# Patient Record
Sex: Female | Born: 1985 | Race: White | Hispanic: No | Marital: Single | Smoking: Never smoker
Health system: Western US, Academic
[De-identification: ages and names within clinical notes are randomized; demographics above are authoritative.]

## PROBLEM LIST (undated history)

## (undated) ENCOUNTER — Inpatient Hospital Stay (HOSPITAL_COMMUNITY): Payer: Self-pay

## (undated) DIAGNOSIS — O1490 Unspecified pre-eclampsia, unspecified trimester: Secondary | ICD-10-CM

## (undated) DIAGNOSIS — N200 Calculus of kidney: Secondary | ICD-10-CM

## (undated) DIAGNOSIS — O139 Gestational [pregnancy-induced] hypertension without significant proteinuria, unspecified trimester: Secondary | ICD-10-CM

## (undated) HISTORY — DX: Gestational (pregnancy-induced) hypertension without significant proteinuria, unspecified trimester: O13.9

## (undated) HISTORY — PX: WISDOM TOOTH EXTRACTION: SHX21

## (undated) HISTORY — PX: NOSE SURGERY: SHX723

## (undated) MED ORDER — HYDROCORTISONE SOD SUCCINATE 100 MG IJ SOLR (CUSTOM)
100.0000 mg | Freq: Once | INTRAMUSCULAR | Status: AC | PRN
Start: 2021-04-04 — End: 2021-04-04

## (undated) MED ORDER — ALBUTEROL SULFATE (5 MG/ML) 0.5% IN NEBU
2.5000 mg | INHALATION_SOLUTION | Freq: Once | RESPIRATORY_TRACT | Status: AC | PRN
Start: 2021-05-22 — End: 2021-04-16

## (undated) MED ORDER — SODIUM CHLORIDE 0.9 % IV SOLN
Freq: Once | INTRAVENOUS | Status: AC
Start: 2021-03-28 — End: 2021-03-28

## (undated) MED ORDER — DIPHENHYDRAMINE HCL 50 MG/ML IJ SOLN
25.0000 mg | Freq: Once | INTRAMUSCULAR | Status: AC | PRN
Start: 2021-04-04 — End: 2021-04-04

## (undated) MED ORDER — DIPHENHYDRAMINE HCL 50 MG/ML IJ SOLN
25.0000 mg | Freq: Once | INTRAMUSCULAR | Status: AC | PRN
Start: 2021-03-28 — End: 2021-03-28

## (undated) MED ORDER — EPINEPHRINE 0.3 MG/0.3ML IJ SOAJ
0.3000 mg | INTRAMUSCULAR | Status: AC | PRN
Start: 2021-05-29 — End: 2021-04-23

## (undated) MED ORDER — HYDROCORTISONE SOD SUCCINATE 100 MG IJ SOLR (CUSTOM)
100.0000 mg | Freq: Once | INTRAMUSCULAR | Status: AC | PRN
Start: 2021-05-22 — End: 2021-04-16

## (undated) MED ORDER — SODIUM CHLORIDE 0.9 % IV SOLN
INTRAVENOUS | Status: AC | PRN
Start: 2021-05-29 — End: ?

## (undated) MED ORDER — SODIUM CHLORIDE 0.9 % IV SOLN
200.0000 mg | Freq: Once | INTRAVENOUS | Status: AC
Start: 2021-04-17 — End: 2021-04-02

## (undated) MED ORDER — SODIUM CHLORIDE 0.9 % IV SOLN
200.0000 mg | Freq: Once | INTRAVENOUS | Status: AC
Start: 2021-04-09 — End: 2021-03-26

## (undated) MED ORDER — SODIUM CHLORIDE 0.9 % IV SOLN
200.0000 mg | Freq: Once | INTRAVENOUS | Status: AC
Start: 2021-05-22 — End: 2021-04-16

## (undated) MED ORDER — EPINEPHRINE 0.3 MG/0.3ML IJ SOAJ
0.3000 mg | INTRAMUSCULAR | Status: AC | PRN
Start: 2021-03-28 — End: 2021-03-28

## (undated) MED ORDER — DIPHENHYDRAMINE HCL 50 MG/ML IJ SOLN
25.0000 mg | Freq: Once | INTRAMUSCULAR | Status: AC | PRN
Start: 2021-05-22 — End: 2021-04-16

## (undated) MED ORDER — SODIUM CHLORIDE 0.9 % IV SOLN
INTRAVENOUS | Status: AC | PRN
Start: 2021-03-28 — End: ?

## (undated) MED ORDER — FAMOTIDINE (PF) 20 MG/2ML IV SOLN
20.0000 mg | Freq: Once | INTRAVENOUS | Status: AC | PRN
Start: 2021-05-22 — End: 2021-04-16

## (undated) MED ORDER — DIPHENHYDRAMINE HCL 50 MG/ML IJ SOLN
25.0000 mg | Freq: Once | INTRAMUSCULAR | Status: AC | PRN
Start: 2021-04-09 — End: 2021-03-26

## (undated) MED ORDER — ALBUTEROL SULFATE (5 MG/ML) 0.5% IN NEBU
2.5000 mg | INHALATION_SOLUTION | Freq: Once | RESPIRATORY_TRACT | Status: AC | PRN
Start: 2021-03-28 — End: 2021-03-28

## (undated) MED ORDER — HYDROCORTISONE SOD SUCCINATE 100 MG IJ SOLR (CUSTOM)
100.0000 mg | Freq: Once | INTRAMUSCULAR | Status: AC | PRN
Start: 2021-04-24 — End: 2021-04-09

## (undated) MED ORDER — SODIUM CHLORIDE 0.9 % IV SOLN
INTRAVENOUS | Status: AC | PRN
Start: 2021-05-22 — End: ?

## (undated) MED ORDER — NAPROXEN 500 MG OR TABS
ORAL_TABLET | ORAL | 0 refills | Status: AC
Start: 2023-02-04 — End: ?

## (undated) MED ORDER — FAMOTIDINE (PF) 20 MG/2ML IV SOLN
20.0000 mg | Freq: Once | INTRAVENOUS | Status: AC | PRN
Start: 2021-05-29 — End: 2021-04-23

## (undated) MED ORDER — SODIUM CHLORIDE 0.9 % IV SOLN
200.0000 mg | Freq: Once | INTRAVENOUS | Status: AC
Start: 2021-05-29 — End: 2021-04-23

## (undated) MED ORDER — ALBUTEROL SULFATE (5 MG/ML) 0.5% IN NEBU
2.5000 mg | INHALATION_SOLUTION | Freq: Once | RESPIRATORY_TRACT | Status: AC | PRN
Start: 2021-04-24 — End: 2021-04-09

## (undated) MED ORDER — SODIUM CHLORIDE 0.9 % IV SOLN
750.0000 mg | Freq: Once | INTRAVENOUS | Status: AC
Start: 2021-04-04 — End: 2021-04-04

## (undated) MED ORDER — SODIUM CHLORIDE 0.9 % IV SOLN
Freq: Once | INTRAVENOUS | Status: AC
Start: 2021-05-29 — End: 2021-04-23

## (undated) MED ORDER — HYDROCORTISONE SOD SUCCINATE 100 MG IJ SOLR (CUSTOM)
100.0000 mg | Freq: Once | INTRAMUSCULAR | Status: AC | PRN
Start: 2021-05-29 — End: 2021-04-23

## (undated) MED ORDER — SODIUM CHLORIDE 0.9 % IV SOLN
200.0000 mg | Freq: Once | INTRAVENOUS | Status: AC
Start: 2021-04-24 — End: 2021-04-09

## (undated) MED ORDER — HYDROCORTISONE SOD SUCCINATE 100 MG IJ SOLR (CUSTOM)
100.0000 mg | Freq: Once | INTRAMUSCULAR | Status: AC | PRN
Start: 2021-04-09 — End: 2021-03-26

## (undated) MED ORDER — ALBUTEROL SULFATE (5 MG/ML) 0.5% IN NEBU
2.5000 mg | INHALATION_SOLUTION | Freq: Once | RESPIRATORY_TRACT | Status: AC | PRN
Start: 2021-05-29 — End: 2021-04-23

## (undated) MED ORDER — SODIUM CHLORIDE 0.9 % IV SOLN
Freq: Once | INTRAVENOUS | Status: AC
Start: 2021-05-22 — End: 2021-04-16

## (undated) MED ORDER — EPINEPHRINE 0.3 MG/0.3ML IJ SOAJ
0.3000 mg | INTRAMUSCULAR | Status: AC | PRN
Start: 2021-04-24 — End: 2021-04-09

## (undated) MED ORDER — EPINEPHRINE 0.3 MG/0.3ML IJ SOAJ
0.3000 mg | INTRAMUSCULAR | Status: AC | PRN
Start: 2021-04-09 — End: 2021-03-26

## (undated) MED ORDER — FAMOTIDINE (PF) 20 MG/2ML IV SOLN
20.0000 mg | Freq: Once | INTRAVENOUS | Status: AC | PRN
Start: 2021-04-09 — End: 2021-03-26

## (undated) MED ORDER — DIPHENHYDRAMINE HCL 50 MG/ML IJ SOLN
25.0000 mg | Freq: Once | INTRAMUSCULAR | Status: AC | PRN
Start: 2021-05-29 — End: 2021-04-23

## (undated) MED ORDER — SODIUM CHLORIDE 0.9 % IV SOLN
INTRAVENOUS | Status: AC | PRN
Start: 2021-04-04 — End: ?

## (undated) MED ORDER — SODIUM CHLORIDE 0.9 % IV SOLN
Freq: Once | INTRAVENOUS | Status: AC
Start: 2021-04-09 — End: 2021-03-26

## (undated) MED ORDER — FAMOTIDINE (PF) 20 MG/2ML IV SOLN
20.0000 mg | Freq: Once | INTRAVENOUS | Status: AC | PRN
Start: 2021-04-24 — End: 2021-04-09

## (undated) MED ORDER — DIPHENHYDRAMINE HCL 50 MG/ML IJ SOLN
25.0000 mg | Freq: Once | INTRAMUSCULAR | Status: AC | PRN
Start: 2021-04-17 — End: 2021-04-02

## (undated) MED ORDER — FAMOTIDINE (PF) 20 MG/2ML IV SOLN
20.0000 mg | Freq: Once | INTRAVENOUS | Status: AC | PRN
Start: 2021-04-04 — End: 2021-04-04

## (undated) MED ORDER — SODIUM CHLORIDE 0.9 % IV SOLN
Freq: Once | INTRAVENOUS | Status: AC
Start: 2021-04-04 — End: 2021-04-04

## (undated) MED ORDER — ALBUTEROL SULFATE (5 MG/ML) 0.5% IN NEBU
2.5000 mg | INHALATION_SOLUTION | Freq: Once | RESPIRATORY_TRACT | Status: AC | PRN
Start: 2021-04-17 — End: 2021-04-02

## (undated) MED ORDER — EPINEPHRINE 0.3 MG/0.3ML IJ SOAJ
0.3000 mg | INTRAMUSCULAR | Status: AC | PRN
Start: 2021-05-22 — End: 2021-04-16

## (undated) MED ORDER — ALBUTEROL SULFATE (5 MG/ML) 0.5% IN NEBU
2.5000 mg | INHALATION_SOLUTION | Freq: Once | RESPIRATORY_TRACT | Status: AC | PRN
Start: 2021-04-09 — End: 2021-03-26

## (undated) MED ORDER — SODIUM CHLORIDE 0.9 % IV SOLN
INTRAVENOUS | Status: AC | PRN
Start: 2021-04-17 — End: ?

## (undated) MED ORDER — FAMOTIDINE (PF) 20 MG/2ML IV SOLN
20.0000 mg | Freq: Once | INTRAVENOUS | Status: AC | PRN
Start: 2021-04-17 — End: 2021-04-02

## (undated) MED ORDER — SODIUM CHLORIDE 0.9 % IV SOLN
INTRAVENOUS | Status: AC | PRN
Start: 2021-04-24 — End: ?

## (undated) MED ORDER — SODIUM CHLORIDE 0.9 % IV SOLN
INTRAVENOUS | Status: AC | PRN
Start: 2021-04-09 — End: ?

## (undated) MED ORDER — SODIUM CHLORIDE 0.9 % IV SOLN
Freq: Once | INTRAVENOUS | Status: AC
Start: 2021-04-24 — End: 2021-04-09

## (undated) MED ORDER — ALBUTEROL SULFATE (5 MG/ML) 0.5% IN NEBU
2.5000 mg | INHALATION_SOLUTION | Freq: Once | RESPIRATORY_TRACT | Status: AC | PRN
Start: 2021-04-04 — End: 2021-04-04

## (undated) MED ORDER — HYDROCORTISONE SOD SUCCINATE 100 MG IJ SOLR (CUSTOM)
100.0000 mg | Freq: Once | INTRAMUSCULAR | Status: AC | PRN
Start: 2021-03-28 — End: 2021-03-28

## (undated) MED ORDER — SODIUM CHLORIDE 0.9 % IV SOLN
Freq: Once | INTRAVENOUS | Status: AC
Start: 2021-04-17 — End: 2021-04-02

## (undated) MED ORDER — FAMOTIDINE (PF) 20 MG/2ML IV SOLN
20.0000 mg | Freq: Once | INTRAVENOUS | Status: AC | PRN
Start: 2021-03-28 — End: 2021-03-28

## (undated) MED ORDER — EPINEPHRINE 0.3 MG/0.3ML IJ SOAJ
0.3000 mg | INTRAMUSCULAR | Status: AC | PRN
Start: 2021-04-04 — End: 2021-04-04

## (undated) MED ORDER — SODIUM CHLORIDE 0.9 % IV SOLN
750.0000 mg | Freq: Once | INTRAVENOUS | Status: AC
Start: 2021-03-28 — End: 2021-03-28

## (undated) MED ORDER — EPINEPHRINE 0.3 MG/0.3ML IJ SOAJ
0.3000 mg | INTRAMUSCULAR | Status: AC | PRN
Start: 2021-04-17 — End: 2021-04-02

## (undated) MED ORDER — HYDROCORTISONE SOD SUCCINATE 100 MG IJ SOLR (CUSTOM)
100.0000 mg | Freq: Once | INTRAMUSCULAR | Status: AC | PRN
Start: 2021-04-17 — End: 2021-04-02

## (undated) MED ORDER — DIPHENHYDRAMINE HCL 50 MG/ML IJ SOLN
25.0000 mg | Freq: Once | INTRAMUSCULAR | Status: AC | PRN
Start: 2021-04-24 — End: 2021-04-09

---
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
## 2006-05-06 ENCOUNTER — Other Ambulatory Visit: Admission: RE | Admit: 2006-05-06 | Discharge: 2006-05-06 | Payer: Self-pay | Admitting: Obstetrics and Gynecology

## 2009-02-19 ENCOUNTER — Emergency Department (HOSPITAL_COMMUNITY): Admission: EM | Admit: 2009-02-19 | Discharge: 2009-02-19 | Payer: Self-pay | Admitting: Emergency Medicine

## 2011-01-15 LAB — URINE MICROSCOPIC-ADD ON

## 2011-01-15 LAB — URINALYSIS, ROUTINE W REFLEX MICROSCOPIC
Glucose, UA: NEGATIVE mg/dL
Ketones, ur: NEGATIVE mg/dL
Protein, ur: NEGATIVE mg/dL

## 2011-01-15 LAB — PREGNANCY, URINE: Preg Test, Ur: NEGATIVE

## 2011-06-04 ENCOUNTER — Emergency Department (HOSPITAL_COMMUNITY)
Admission: EM | Admit: 2011-06-04 | Discharge: 2011-06-04 | Disposition: A | Discharge status: Home / Self Care | Payer: BC Managed Care – PPO | Attending: Emergency Medicine | Admitting: Emergency Medicine

## 2011-06-04 DIAGNOSIS — R319 Hematuria, unspecified: Secondary | ICD-10-CM | POA: Insufficient documentation

## 2011-06-04 DIAGNOSIS — R109 Unspecified abdominal pain: Secondary | ICD-10-CM | POA: Insufficient documentation

## 2011-06-04 DIAGNOSIS — R110 Nausea: Secondary | ICD-10-CM | POA: Insufficient documentation

## 2011-06-04 LAB — POCT PREGNANCY, URINE: Preg Test, Ur: NEGATIVE

## 2011-06-04 LAB — URINE MICROSCOPIC-ADD ON

## 2011-06-04 LAB — URINALYSIS, ROUTINE W REFLEX MICROSCOPIC
Ketones, ur: NEGATIVE mg/dL
Urobilinogen, UA: 0.2 mg/dL (ref 0.0–1.0)

## 2011-06-05 LAB — URINE CULTURE: Culture  Setup Time: 201208280640

## 2012-03-15 ENCOUNTER — Encounter (HOSPITAL_COMMUNITY): Payer: Self-pay | Admitting: Emergency Medicine

## 2012-03-15 ENCOUNTER — Emergency Department (HOSPITAL_COMMUNITY)
Admission: EM | Admit: 2012-03-15 | Discharge: 2012-03-15 | Disposition: A | Discharge status: Home / Self Care | Payer: BC Managed Care – PPO | Attending: Emergency Medicine | Admitting: Emergency Medicine

## 2012-03-15 ENCOUNTER — Emergency Department (HOSPITAL_COMMUNITY): Payer: BC Managed Care – PPO

## 2012-03-15 DIAGNOSIS — R10819 Abdominal tenderness, unspecified site: Secondary | ICD-10-CM | POA: Insufficient documentation

## 2012-03-15 DIAGNOSIS — N132 Hydronephrosis with renal and ureteral calculous obstruction: Secondary | ICD-10-CM

## 2012-03-15 DIAGNOSIS — M545 Low back pain, unspecified: Secondary | ICD-10-CM | POA: Insufficient documentation

## 2012-03-15 DIAGNOSIS — N201 Calculus of ureter: Secondary | ICD-10-CM | POA: Insufficient documentation

## 2012-03-15 DIAGNOSIS — R109 Unspecified abdominal pain: Secondary | ICD-10-CM | POA: Insufficient documentation

## 2012-03-15 HISTORY — DX: Calculus of kidney: N20.0

## 2012-03-15 LAB — URINALYSIS, ROUTINE W REFLEX MICROSCOPIC
Bilirubin Urine: NEGATIVE
Glucose, UA: NEGATIVE mg/dL
Ketones, ur: NEGATIVE mg/dL
Leukocytes, UA: NEGATIVE
Nitrite: NEGATIVE
Specific Gravity, Urine: 1.015 (ref 1.005–1.030)
pH: 7 (ref 5.0–8.0)

## 2012-03-15 LAB — CBC
HCT: 42.2 % (ref 36.0–46.0)
Hemoglobin: 14.2 g/dL (ref 12.0–15.0)
MCH: 29.4 pg (ref 26.0–34.0)
MCHC: 33.6 g/dL (ref 30.0–36.0)
RDW: 12.6 % (ref 11.5–15.5)

## 2012-03-15 LAB — BASIC METABOLIC PANEL
BUN: 11 mg/dL (ref 6–23)
Calcium: 9.4 mg/dL (ref 8.4–10.5)
Creatinine, Ser: 0.72 mg/dL (ref 0.50–1.10)
GFR calc Af Amer: >90 mL/min (ref >90)
GFR calc non Af Amer: >90 mL/min (ref >90)
Glucose, Bld: 99 mg/dL (ref 70–99)

## 2012-03-15 LAB — WET PREP, GENITAL: Yeast Wet Prep HPF POC: NONE SEEN

## 2012-03-15 MED ORDER — SODIUM CHLORIDE 0.9 % IV BOLUS (SEPSIS)
1000.0000 mL | Freq: Once | INTRAVENOUS | Status: AC
  Administered 2012-03-15: 1000 mL via INTRAVENOUS

## 2012-03-15 MED ORDER — ONDANSETRON HCL 4 MG/2ML IJ SOLN
4.0000 mg | Freq: Once | INTRAMUSCULAR | Status: AC
  Administered 2012-03-15: 4 mg via INTRAVENOUS
  Filled 2012-03-15: qty 2

## 2012-03-15 MED ORDER — LABETALOL HCL 5 MG/ML IV SOLN: 5.0000 mg | Freq: Once | INTRAVENOUS | Status: DC

## 2012-03-15 MED ORDER — ONDANSETRON HCL 4 MG PO TABS: 4.0000 mg | ORAL_TABLET | Freq: Four times a day (QID) | ORAL | Status: AC

## 2012-03-15 MED ORDER — OXYCODONE-ACETAMINOPHEN 5-325 MG PO TABS: 1.0000 | ORAL_TABLET | Freq: Four times a day (QID) | ORAL | Status: AC | PRN

## 2012-03-15 MED ORDER — SODIUM CHLORIDE 0.9 % IV SOLN: 250.0000 mL | Freq: Once | INTRAVENOUS | Status: DC

## 2012-03-15 MED ORDER — ALTEPLASE (STROKE) FULL DOSE INFUSION: 0.9000 mg/kg | Freq: Once | INTRAVENOUS | Status: DC

## 2012-03-15 MED ORDER — MORPHINE SULFATE 4 MG/ML IJ SOLN
4.0000 mg | Freq: Once | INTRAMUSCULAR | Status: AC
Start: 2012-03-15 — End: 2012-03-15
  Administered 2012-03-15: 4 mg via INTRAVENOUS
  Filled 2012-03-15: qty 1

## 2012-03-15 NOTE — ED Notes (Signed)
Pt presents to department for evaluation of lower abdominal and lower back pain. Onset this morning. Also states nausea. Denies urinary symptoms at the time. States history of kidney stones. 9/10 pain at the time. Abdomen soft and non tender to palpation. Skin warm and dry. She is alert and oriented x4. She recently started taking a new birth control pill. No signs of distress noted.

## 2012-03-15 NOTE — ED Notes (Signed)
Pt c/o low abdominal pain and left low back pain onset this morning. Pt reports pain feels same like kidney stone just in different place. Pt reports nausea. Pt recently started on new birth control pill.

## 2012-03-15 NOTE — ED Notes (Signed)
Pelvic cart at the bedside. Provider notified.

## 2012-03-15 NOTE — Discharge Instructions (Signed)
Kidney Stones       Kidney stones (ureteral lithiasis) are deposits that form inside your kidneys. The intense pain is caused by the stone moving through the urinary tract. When the stone moves, the ureter goes into spasm around the stone. The stone is usually passed in the urine.   CAUSES   A disorder that makes certain neck glands produce too much parathyroid hormone (primary hyperparathyroidism).   A buildup of uric acid crystals.   Narrowing (stricture) of the ureter.   A kidney obstruction present at birth (congenital obstruction).   Previous surgery on the kidney or ureters.   Numerous kidney infections.  SYMPTOMS   Feeling sick to your stomach (nauseous).   Throwing up (vomiting).   Blood in the urine (hematuria).   Pain that usually spreads (radiates) to the groin.   Frequency or urgency of urination.  DIAGNOSIS   Taking a history and physical exam.   Blood or urine tests.   Computerized X-ray scan (CT scan).   Occasionally, an examination of the inside of the urinary bladder (cystoscopy) is performed.  TREATMENT   Observation.   Increasing your fluid intake.   Surgery may be needed if you have severe pain or persistent obstruction.  The size, location, and chemical composition are all important variables that will determine the proper choice of action for you. Talk to your caregiver to better understand your situation so that you will minimize the risk of injury to yourself and your kidney.   HOME CARE INSTRUCTIONS   Drink enough water and fluids to keep your urine clear or pale yellow.   Strain all urine through the provided strainer. Keep all particulate matter and stones for your caregiver to see. The stone causing the pain may be as small as a grain of salt. It is very important to use the strainer each and every time you pass your urine. The collection of your stone will allow your caregiver to analyze it and verify that a stone has actually passed.   Only take over-the-counter or prescription  medicines for pain, discomfort, or fever as directed by your caregiver.   Make a follow-up appointment with your caregiver as directed.   Get follow-up X-rays if required. The absence of pain does not always mean that the stone has passed. It may have only stopped moving. If the urine remains completely obstructed, it can cause loss of kidney function or even complete destruction of the kidney. It is your responsibility to make sure X-rays and follow-ups are completed. Ultrasounds of the kidney can show blockages and the status of the kidney. Ultrasounds are not associated with any radiation and can be performed easily in a matter of minutes.  SEEK IMMEDIATE MEDICAL CARE IF:   Pain cannot be controlled with the prescribed medicine.   You have a fever.   The severity or intensity of pain increases over 18 hours and is not relieved by pain medicine.   You develop a new onset of abdominal pain.   You feel faint or pass out.  MAKE SURE YOU:   Understand these instructions.   Will watch your condition.   Will get help right away if you are not doing well or get worse.  Document Released: 09/23/2005 Document Revised: 09/12/2011 Document Reviewed: 01/19/2010   ExitCare Patient Information 2012 [employer]     Urine Strainer   This strainer is used to catch or filter out any stones found in your urine. Place the strainer under your   urine stream. Save any stones or objects that you find in your urine. Place them in a plastic or glass container to show your caregiver. The stones vary in size - some can be very small, so make sure you check the strainer carefully. Your caregiver may send the stone to the lab. When the results are back, your caregiver may recommend medicines or diet changes.   Document Released: 06/28/2004 Document Revised: 09/12/2011 Document Reviewed: 08/05/2008   [employer] Patient Information 2012 [employer]

## 2012-03-15 NOTE — ED Provider Notes (Signed)
Medical screening examination/treatment/procedure(s) were performed by non-physician practitioner and as supervising physician I was immediately available for consultation/collaboration.    Dione Booze, MD 03/15/12 1517

## 2012-03-15 NOTE — ED Provider Notes (Signed)
History     CSN: [id number]  Arrival date & time 03/15/12  [id number]   First MD Initiated Contact with Patient 03/15/12 [phone]      Chief Complaint  Patient presents with  . Abdominal Pain  . Back Pain    (Consider location/radiation/quality/duration/timing/severity/associated sxs/prior treatment) HPI  Patient to the ED with complaints of low back pain and low suprapubic abdominal pain. It started acutely this morning and the pains are sharp and knife life. She states that she has had kidney stones before and that the quality of this pain is the same, however, a different location. She also feels pressure. She denies diarrhea, vomiting, recent illness or injury.  Past Medical History  Diagnosis Date  . Kidney stones     Past Surgical History  Procedure Date  . Nose surgery     History reviewed. No pertinent family history.  History  Substance Use Topics  . Smoking status: Never Smoker   . Smokeless tobacco: Not on file  . Alcohol Use: Yes    OB History    Grav Para Term Preterm Abortions TAB SAB Ect Mult Living                  Review of Systems   HEENT: denies blurry vision or change in hearing PULMONARY: Denies difficulty breathing and SOB CARDIAC: denies chest pain or heart palpitations MUSCULOSKELETAL:  denies being unable to ambulate ABDOMEN AL: denies diarrhea GU: denies loss of bowel or urinary control NEURO: denies numbness and tingling in extremities SKIN: no new rashes PSYCH: patient behavior is normal NECK: Not complaining of neck pain     Allergies  Review of patient's allergies indicates no known allergies.  Home Medications   Current Outpatient Rx  Name Route Sig Dispense Refill  . NORGESTIMATE-ETH ESTRADIOL 0.25-35 MG-MCG PO TABS Oral Take 1 tablet by mouth daily.    Marland Kitchen ONDANSETRON HCL 4 MG PO TABS Oral Take 1 tablet (4 mg total) by mouth every 6 (six) hours. 12 tablet 0  . OXYCODONE-ACETAMINOPHEN 5-325 MG PO TABS Oral Take 1 tablet by  mouth every 6 (six) hours as needed for pain. 15 tablet 0    BP 130/83  Pulse 87  Temp(Src) 97.9 F (36.6 C) (Oral)  Resp 18  SpO2 99%  LMP 02/21/2012  Physical Exam  Nursing note and vitals reviewed. Constitutional: She appears well-developed and well-nourished. No distress.  HENT:  Head: Normocephalic and atraumatic.  Eyes: Pupils are equal, round, and reactive to light.  Neck: Normal range of motion. Neck supple.  Cardiovascular: Normal rate and regular rhythm.   Pulmonary/Chest: Effort normal.  Abdominal: Soft. She exhibits no distension. There is tenderness (suprapubic and LLQ). There is no rebound and no guarding.  Genitourinary: Vagina normal and uterus normal. There is no tenderness on the right labia. There is no tenderness on the left labia. Cervix exhibits no motion tenderness, no discharge and no friability. Right adnexum displays no mass and no tenderness. Left adnexum displays no mass and no tenderness.  Neurological: She is alert.  Skin: Skin is warm and dry.    ED Course  Procedures (including critical care time)  Labs Reviewed  URINALYSIS, ROUTINE W REFLEX MICROSCOPIC - Abnormal; Notable for the following:    Hgb urine dipstick SMALL (*)    All other components within normal limits  WET PREP, GENITAL - Abnormal; Notable for the following:    WBC, Wet Prep HPF POC FEW (*)    All other components  within normal limits  PREGNANCY, URINE  CBC  BASIC METABOLIC PANEL  URINE MICROSCOPIC-ADD ON  GC/CHLAMYDIA PROBE AMP, GENITAL   Ct Abdomen Pelvis Wo Contrast  03/15/2012  *RADIOLOGY REPORT*  Clinical Data: History of kidney stones and low back pain.  CT ABDOMEN AND PELVIS WITHOUT CONTRAST  Technique:  Multidetector CT imaging of the abdomen and pelvis was performed following the standard protocol without intravenous contrast.  Comparison: Abdominal radiograph 02/19/2009  Findings: The lung bases are clear.  There is no evidence for free air. Unenhanced CT was  performed per clinician order.  Lack of IV contrast limits sensitivity and specificity, especially for evaluation of abdominal/pelvic solid viscera.  There are multiple small calcifications throughout both kidneys suggestive for kidney stones.  Largest kidney stone is in the left lower pole and measures 5 mm.  There is a stone at the left base of the bladder which is probably at the left ureterovesical junction. This stone roughly measures 4 mm in size. There is a 7 mm calcification in the right hemi pelvis.  This is adjacent the right adnexa and the cecum.  This may represent an appendicolith.  There is no inflammation in this area to suggest acute appendicitis. There is a moderate amount of stool throughout the colon.  No gross abnormality of the liver, gallbladder, spleen, pancreas or adrenal tissue.  No significant free fluid or lymphadenopathy within the abdomen or pelvis. No acute bony abnormality.  IMPRESSION: Bilateral renal calculi.  Mild left hydroureteronephrosis.  There is a 4 mm stone at the left ureterovesical junction.  There is a 7 mm calcification in the right hemipelvis.  The exact location of this calcification is uncertain but may represent an appendicolith.  No evidence for acute appendix inflammation.  Original Report Authenticated By: Richarda Overlie, M.D.     1. Ureteral stone with hydronephrosis       MDM  Patient laboratory work-up is negative for acute findings.  Patients CT of the abdomen shows a 4mm stone in the left UVJ. Patient should be able to pass this on her own. Patient has NO pain the the RLQ as i see 7mm calcification noted in the RLQ.  Patient plan is to 1. Strain all urine 2. Rx for percocet 3. Zofran 4. Work note 5. Referral to Urology  Pt has been advised of the symptoms that warrant their return to the ED. Patient has voiced understanding and has agreed to follow-up with the PCP or specialist.         Dorthula Matas, PA 03/15/12 1115

## 2012-03-16 LAB — GC/CHLAMYDIA PROBE AMP, GENITAL
Chlamydia, DNA Probe: NEGATIVE
GC Probe Amp, Genital: NEGATIVE

## 2012-09-17 ENCOUNTER — Other Ambulatory Visit: Payer: Self-pay | Admitting: Family Medicine

## 2012-09-17 ENCOUNTER — Ambulatory Visit
Admission: RE | Admit: 2012-09-17 | Discharge: 2012-09-17 | Disposition: A | Discharge status: Home / Self Care | Payer: BC Managed Care – PPO | Source: Ambulatory Visit | Attending: Family Medicine | Admitting: Family Medicine

## 2012-09-17 DIAGNOSIS — R222 Localized swelling, mass and lump, trunk: Secondary | ICD-10-CM

## 2012-10-07 DIAGNOSIS — O1490 Unspecified pre-eclampsia, unspecified trimester: Secondary | ICD-10-CM

## 2012-10-07 HISTORY — DX: Unspecified pre-eclampsia, unspecified trimester: O14.90

## 2013-09-06 ENCOUNTER — Inpatient Hospital Stay (HOSPITAL_COMMUNITY)
Admission: AD | Admit: 2013-09-06 | Discharge: 2013-09-06 | Disposition: A | Discharge status: Home / Self Care | Payer: 59 | Source: Ambulatory Visit | Attending: Obstetrics and Gynecology | Admitting: Obstetrics and Gynecology

## 2013-09-06 ENCOUNTER — Encounter (HOSPITAL_COMMUNITY): Payer: Self-pay | Admitting: *Deleted

## 2013-09-06 DIAGNOSIS — O133 Gestational [pregnancy-induced] hypertension without significant proteinuria, third trimester: Secondary | ICD-10-CM

## 2013-09-06 DIAGNOSIS — R109 Unspecified abdominal pain: Secondary | ICD-10-CM

## 2013-09-06 DIAGNOSIS — R030 Elevated blood-pressure reading, without diagnosis of hypertension: Secondary | ICD-10-CM

## 2013-09-06 DIAGNOSIS — R51 Headache: Secondary | ICD-10-CM

## 2013-09-06 DIAGNOSIS — IMO0002 Reserved for concepts with insufficient information to code with codable children: Secondary | ICD-10-CM | POA: Insufficient documentation

## 2013-09-06 DIAGNOSIS — O139 Gestational [pregnancy-induced] hypertension without significant proteinuria, unspecified trimester: Secondary | ICD-10-CM | POA: Insufficient documentation

## 2013-09-06 DIAGNOSIS — H543 Unqualified visual loss, both eyes: Secondary | ICD-10-CM

## 2013-09-06 LAB — COMPREHENSIVE METABOLIC PANEL
Albumin: 3.1 g/dL — ABNORMAL LOW (ref 3.5–5.2)
Alkaline Phosphatase: 143 U/L — ABNORMAL HIGH (ref 39–117)
BUN: 7 mg/dL (ref 6–23)
CO2: 23 mEq/L (ref 19–32)
Chloride: 103 mEq/L (ref 96–112)
GFR calc non Af Amer: >90 mL/min (ref >90)
Potassium: 3.8 mEq/L (ref 3.5–5.1)
Total Bilirubin: 0.3 mg/dL (ref 0.3–1.2)

## 2013-09-06 LAB — CBC
MCH: 30 pg (ref 26.0–34.0)
MCV: 87.8 fL (ref 78.0–100.0)
Platelets: 197 10*3/uL (ref 150–400)
RDW: 13.4 % (ref 11.5–15.5)
WBC: 13 10*3/uL — ABNORMAL HIGH (ref 4.0–10.5)

## 2013-09-06 LAB — URINALYSIS, ROUTINE W REFLEX MICROSCOPIC
Glucose, UA: NEGATIVE mg/dL
Ketones, ur: NEGATIVE mg/dL
Protein, ur: NEGATIVE mg/dL

## 2013-09-06 LAB — URINE MICROSCOPIC-ADD ON

## 2013-09-06 LAB — URIC ACID: Uric Acid, Serum: 4.8 mg/dL (ref 2.4–7.0)

## 2013-09-06 MED ORDER — BETAMETHASONE SOD PHOS & ACET 6 (3-3) MG/ML IJ SUSP
12.0000 mg | Freq: Once | INTRAMUSCULAR | Status: AC
  Administered 2013-09-06: 12 mg via INTRAMUSCULAR
  Filled 2013-09-06: qty 2

## 2013-09-06 MED ORDER — LABETALOL HCL 100 MG PO TABS
100.0000 mg | ORAL_TABLET | Freq: Once | ORAL | Status: AC
  Administered 2013-09-06: 100 mg via ORAL
  Filled 2013-09-06: qty 1

## 2013-09-06 MED ORDER — LABETALOL HCL 100 MG PO TABS: 100.0000 mg | ORAL_TABLET | Freq: Two times a day (BID) | ORAL | Status: DC

## 2013-09-06 NOTE — MAU Provider Note (Signed)
HPI:  Ms. NYIA TSAO is a 27 y.o. female G1P0 at Unknown gestation who presents with elevated BP.  Her primary care MD was concerned last week about her BP, and increased weight gain. She had her BP at school today and it was very elevated; she went home and rested and BP went down slightly. She went by the Dr. Isidore Moos this afternoon, and they sent her here for further work up.  Today is the highest her BP has been. She denies HA, blurred vision, upper abdominal pain. She does feel her swelling in her feet has increased in the past week. She denies pain at this time.    Objective:  GENERAL: Well-developed, well-nourished female in no acute distress.  HEENT: Normocephalic, atraumatic.   LUNGS: Effort normal HEART: Regular rate  SKIN: Warm, dry and without erythema, edema to bilateral lower extremities.  PSYCH: Normal mood and affect  Filed Vitals:   09/06/13 1835  BP: 165/100  Pulse: 91  Resp: 16  Height: 5\' 5"  (1.651 m)  Weight: 68.493 kg (151 lb)   Fetal Tracing: Baseline: 135 bpm  Variability: Moderate  Accelerations: 15x15 Decelerations: None  Toco: none   Pt came with orders from Dr. Ambrose Mantle; RN to call Dr. Ambrose Mantle with results 2100 RN reviewed BLood pressure and results to labetalol with Dr. Ambrose Mantle, orders for BMZ and Labetalol BID. Return tomorrow for another BMZ.   A/P:  Gestational Hypertension FU in 1 day for repeat BMZ Start Labetalol   Iona Hansen Rasch, NP 09/06/2013 7:01 PM

## 2013-09-07 ENCOUNTER — Inpatient Hospital Stay (HOSPITAL_COMMUNITY)
Admission: AD | Admit: 2013-09-07 | Discharge: 2013-09-07 | Disposition: A | Discharge status: Home / Self Care | Payer: 59 | Source: Ambulatory Visit | Attending: Obstetrics and Gynecology | Admitting: Obstetrics and Gynecology

## 2013-09-07 DIAGNOSIS — O4700 False labor before 37 completed weeks of gestation, unspecified trimester: Secondary | ICD-10-CM | POA: Insufficient documentation

## 2013-09-07 MED ORDER — BETAMETHASONE SOD PHOS & ACET 6 (3-3) MG/ML IJ SUSP
12.0000 mg | Freq: Once | INTRAMUSCULAR | Status: AC
  Administered 2013-09-07: 12 mg via INTRAMUSCULAR
  Filled 2013-09-07: qty 2

## 2013-09-27 ENCOUNTER — Other Ambulatory Visit: Payer: Self-pay | Admitting: Obstetrics and Gynecology

## 2013-09-30 ENCOUNTER — Inpatient Hospital Stay (HOSPITAL_COMMUNITY)
Admission: AD | Admit: 2013-09-30 | Discharge: 2013-10-04 | DRG: 765 | Disposition: A | Discharge status: Home / Self Care | Payer: 59 | Source: Ambulatory Visit | Attending: Obstetrics and Gynecology | Admitting: Obstetrics and Gynecology

## 2013-09-30 ENCOUNTER — Encounter (HOSPITAL_COMMUNITY): Payer: Self-pay | Admitting: *Deleted

## 2013-09-30 ENCOUNTER — Inpatient Hospital Stay (HOSPITAL_COMMUNITY)
Admission: AD | Admit: 2013-09-30 | Discharge: 2013-09-30 | Disposition: A | Discharge status: Home / Self Care | Payer: 59 | Source: Ambulatory Visit | Attending: Obstetrics and Gynecology | Admitting: Obstetrics and Gynecology

## 2013-09-30 DIAGNOSIS — O1493 Unspecified pre-eclampsia, third trimester: Secondary | ICD-10-CM

## 2013-09-30 DIAGNOSIS — O139 Gestational [pregnancy-induced] hypertension without significant proteinuria, unspecified trimester: Secondary | ICD-10-CM | POA: Insufficient documentation

## 2013-09-30 DIAGNOSIS — O1414 Severe pre-eclampsia complicating childbirth: Principal | ICD-10-CM | POA: Diagnosis present

## 2013-09-30 DIAGNOSIS — O1490 Unspecified pre-eclampsia, unspecified trimester: Secondary | ICD-10-CM | POA: Diagnosis present

## 2013-09-30 DIAGNOSIS — O1423 HELLP syndrome (HELLP), third trimester: Secondary | ICD-10-CM

## 2013-09-30 LAB — CBC
HCT: 32.5 % — ABNORMAL LOW (ref 36.0–46.0)
Hemoglobin: 12.3 g/dL (ref 12.0–15.0)
MCH: 30.1 pg (ref 26.0–34.0)
MCHC: 33.8 g/dL (ref 30.0–36.0)
MCHC: 34.5 g/dL (ref 30.0–36.0)
MCV: 89 fL (ref 78.0–100.0)
MCV: 88.4 fL (ref 78.0–100.0)
Platelets: 161 10*3/uL (ref 150–400)
RDW: 13.5 % (ref 11.5–15.5)
RDW: 13.5 % (ref 11.5–15.5)
WBC: 13 10*3/uL — ABNORMAL HIGH (ref 4.0–10.5)

## 2013-09-30 LAB — COMPREHENSIVE METABOLIC PANEL
ALT: 32 U/L (ref 0–35)
AST: 15 U/L (ref 0–37)
Albumin: 2.6 g/dL — ABNORMAL LOW (ref 3.5–5.2)
Albumin: 2.8 g/dL — ABNORMAL LOW (ref 3.5–5.2)
BUN: 11 mg/dL (ref 6–23)
CO2: 21 mEq/L (ref 19–32)
Calcium: 9.1 mg/dL (ref 8.4–10.5)
Calcium: 9.5 mg/dL (ref 8.4–10.5)
Creatinine, Ser: 0.58 mg/dL (ref 0.50–1.10)
GFR calc Af Amer: >90 mL/min (ref >90)
GFR calc non Af Amer: >90 mL/min (ref >90)
Glucose, Bld: 92 mg/dL (ref 70–99)
Sodium: 136 mEq/L (ref 135–145)
Total Bilirubin: 0.2 mg/dL — ABNORMAL LOW (ref 0.3–1.2)
Total Protein: 6.2 g/dL (ref 6.0–8.3)
Total Protein: 6.8 g/dL (ref 6.0–8.3)

## 2013-09-30 LAB — URINALYSIS, ROUTINE W REFLEX MICROSCOPIC
Bilirubin Urine: NEGATIVE
Glucose, UA: NEGATIVE mg/dL
Glucose, UA: NEGATIVE mg/dL
Ketones, ur: NEGATIVE mg/dL
Leukocytes, UA: NEGATIVE
Nitrite: NEGATIVE
Protein, ur: 100 mg/dL — AB
Protein, ur: >300 mg/dL — AB
Specific Gravity, Urine: >1.03 — ABNORMAL HIGH (ref 1.005–1.030)
Urobilinogen, UA: 0.2 mg/dL (ref 0.0–1.0)

## 2013-09-30 LAB — URINE MICROSCOPIC-ADD ON

## 2013-09-30 MED ORDER — CITRIC ACID-SODIUM CITRATE 334-500 MG/5ML PO SOLN
30.0000 mL | Freq: Once | ORAL | Status: AC
  Administered 2013-10-01: 30 mL via ORAL
  Filled 2013-09-30: qty 15

## 2013-09-30 MED ORDER — LACTATED RINGERS IV SOLN
INTRAVENOUS | Status: DC
  Administered 2013-09-30 – 2013-10-01 (×3): via INTRAVENOUS

## 2013-09-30 MED ORDER — MAGNESIUM SULFATE BOLUS VIA INFUSION
4.0000 g | Freq: Once | INTRAVENOUS | Status: AC
  Administered 2013-09-30: 4 g via INTRAVENOUS
  Filled 2013-09-30: qty 500

## 2013-09-30 MED ORDER — LABETALOL HCL 5 MG/ML IV SOLN: 40.0000 mg | Freq: Once | INTRAVENOUS | Status: DC | PRN

## 2013-09-30 MED ORDER — FAMOTIDINE IN NACL 20-0.9 MG/50ML-% IV SOLN
20.0000 mg | Freq: Once | INTRAVENOUS | Status: AC
  Administered 2013-09-30: 20 mg via INTRAVENOUS
  Filled 2013-09-30: qty 50

## 2013-09-30 MED ORDER — LABETALOL HCL 5 MG/ML IV SOLN
10.0000 mg | Freq: Once | INTRAVENOUS | Status: AC
  Administered 2013-09-30: 10 mg via INTRAVENOUS
  Filled 2013-09-30: qty 4

## 2013-09-30 MED ORDER — MAGNESIUM SULFATE 40 G IN LACTATED RINGERS - SIMPLE
2.0000 g/h | INTRAVENOUS | Status: DC
  Administered 2013-09-30: 2 g/h via INTRAVENOUS
  Filled 2013-09-30: qty 500

## 2013-09-30 MED ORDER — LABETALOL HCL 5 MG/ML IV SOLN
20.0000 mg | Freq: Once | INTRAVENOUS | Status: AC
  Administered 2013-09-30: 20 mg via INTRAVENOUS

## 2013-09-30 MED ORDER — LABETALOL HCL 5 MG/ML IV SOLN
20.0000 mg | Freq: Once | INTRAVENOUS | Status: AC
  Administered 2013-09-30: 20 mg via INTRAVENOUS
  Filled 2013-09-30: qty 4

## 2013-09-30 MED ORDER — LABETALOL HCL 5 MG/ML IV SOLN
40.0000 mg | Freq: Once | INTRAVENOUS | Status: AC | PRN
  Filled 2013-09-30: qty 8

## 2013-09-30 NOTE — MAU Note (Signed)
Sent for [facility] eval; 

## 2013-09-30 NOTE — MAU Note (Signed)
Patient presents to MAU with c/o high blood pressure and headache. Denies blurry vision at this time. Denies LOF, VB, or contractions. Reports good fetal movement.

## 2013-09-30 NOTE — MAU Provider Note (Signed)
Chief Complaint:  No chief complaint on file.   First Provider Initiated Contact with Patient 09/30/13 2234     HPI: Sherry Cobb is a 27 y.o. G2P0 at [redacted]w[redacted]d who presents to maternity admissions reporting high blood pressure, severe heartburn and HA since 2100. Seen in MAU this morning for [facility] labs. Feels much worse. Denies blurry vision, contractions, leakage of fluid or vaginal bleeding. Good fetal movement.   Pregnancy Course:  In antenatal testing for mild preeclampsia. 24 hour urine on 09/22/2013 = 340. On labetalol 100 mg 3 times a day. BMZ 12/1 and 12/2.  Past Medical History: Past Medical History  Diagnosis Date  . Kidney stones     Past obstetric history: OB History  Gravida Para Term Preterm AB SAB TAB Ectopic Multiple Living  1         0    # Outcome Date GA Lbr Len/2nd Weight Sex Delivery Anes PTL Lv  1 CUR               Past Surgical History: Past Surgical History  Procedure Laterality Date  . Nose surgery    . Wisdom tooth extraction       Family History: Family History  Problem Relation Age of Onset  . Hypertension Mother   . Hypertension Father   . Diabetes Father   . Cancer Father   . Kidney disease Father   . Heart disease Maternal Grandmother   . Heart disease Maternal Grandfather   . Heart disease Paternal Grandmother   . Heart disease Paternal Grandfather     Social History: History  Substance Use Topics  . Smoking status: Never Smoker   . Smokeless tobacco: Not on file  . Alcohol Use: Yes     Comment: not while pregnant    Allergies: No Known Allergies  Meds:  Prescriptions prior to admission  Medication Sig Dispense Refill  . acetaminophen (TYLENOL) 500 MG tablet Take 500 mg by mouth every 6 (six) hours as needed (foot pain).      . cetirizine (ZYRTEC) 10 MG tablet Take 10 mg by mouth at bedtime as needed for allergies.      . famotidine (PEPCID) 10 MG tablet Take 10 mg by mouth at bedtime as needed for heartburn or indigestion.       Marland Kitchen labetalol (NORMODYNE) 100 MG tablet Take 1 tablet (100 mg total) by mouth 2 (two) times daily.  60 tablet  0  . Prenatal Vit-Fe Fumarate-FA (PRENATAL MULTIVITAMIN) TABS tablet Take 1 tablet by mouth at bedtime.         ROS: Pertinent findings in history of present illness.  Physical Exam  Blood pressure 198/116, pulse 88. 188/111 198/116 Patient Vitals for the past 24 hrs:  BP Temp Temp src Pulse Resp SpO2  09/30/13 2342 165/88 mmHg - - 80 - -  09/30/13 2337 185/104 mmHg - - 77 - -  09/30/13 2332 188/111 mmHg - - 78 - -  09/30/13 2327 194/113 mmHg - - 77 - -  09/30/13 2322 182/112 mmHg - - 74 - -  09/30/13 2320 197/109 mmHg - - 76 20 -  09/30/13 2315 204/119 mmHg - - 79 20 -  09/30/13 2310 195/114 mmHg - - 85 20 -  09/30/13 2307 195/114 mmHg - - 85 - -  09/30/13 2303 202/117 mmHg - - 85 - -  09/30/13 2302 210/114 mmHg - - 88 - -  09/30/13 2252 210/120 mmHg - - 88 - -  09/30/13 2242 200/124 mmHg - - 93 - -  09/30/13 2232 198/116 mmHg 98.4 F (36.9 C) Oral 88 20 100 %  09/30/13 2222 188/111 mmHg - - 90 - -    GENERAL: Well-developed, well-nourished female in moderate distress. Anxious.   HEENT: normocephalic HEART: normal rate RESP: normal effort ABDOMEN: Soft, non-tender, gravid appropriate for gestational age. Clutching epigastric area. EXTREMITIES: Nontender, no edema NEURO: alert and oriented. Deep tendon reflexes 3+. One beat clonus bilaterally. SPECULUM EXAM: Deferred Dilation: Closed Exam by:: Dr. Ambrose Mantle  FHT:  Baseline 140 , moderate variability, accelerations present, no decelerations Contractions: Uterine irritability   Labs: Results for orders placed during the hospital encounter of 09/30/13 (from the past 24 hour(s))  URINALYSIS, ROUTINE W REFLEX MICROSCOPIC     Status: Abnormal   Collection Time    09/30/13 10:15 PM      Result Value Range   Color, Urine YELLOW  YELLOW   APPearance CLEAR  CLEAR   Specific Gravity, Urine 1.020  1.005 - 1.030   pH 7.5   5.0 - 8.0   Glucose, UA NEGATIVE  NEGATIVE mg/dL   Hgb urine dipstick MODERATE (*) NEGATIVE   Bilirubin Urine NEGATIVE  NEGATIVE   Ketones, ur NEGATIVE  NEGATIVE mg/dL   Protein, ur >086 (*) NEGATIVE mg/dL   Urobilinogen, UA 0.2  0.0 - 1.0 mg/dL   Nitrite NEGATIVE  NEGATIVE   Leukocytes, UA NEGATIVE  NEGATIVE  URINE MICROSCOPIC-ADD ON     Status: Abnormal   Collection Time    09/30/13 10:15 PM      Result Value Range   Squamous Epithelial / LPF RARE  RARE   WBC, UA 3-6  <3 WBC/hpf   RBC / HPF 7-10  <3 RBC/hpf   Bacteria, UA FEW (*) RARE  COMPREHENSIVE METABOLIC PANEL     Status: Abnormal   Collection Time    09/30/13 10:45 PM      Result Value Range   Sodium 136  135 - 145 mEq/L   Potassium 4.3  3.5 - 5.1 mEq/L   Chloride 102  96 - 112 mEq/L   CO2 21  19 - 32 mEq/L   Glucose, Bld 92  70 - 99 mg/dL   BUN 12  6 - 23 mg/dL   Creatinine, Ser 5.78  0.50 - 1.10 mg/dL   Calcium 9.5  8.4 - 46.9 mg/dL   Total Protein 6.8  6.0 - 8.3 g/dL   Albumin 2.8 (*) 3.5 - 5.2 g/dL   AST 43 (*) 0 - 37 U/L   ALT 32  0 - 35 U/L   Alkaline Phosphatase 177 (*) 39 - 117 U/L   Total Bilirubin 0.2 (*) 0.3 - 1.2 mg/dL   GFR calc non Af Amer >90  >90 mL/min   GFR calc Af Amer >90  >90 mL/min  CBC     Status: Abnormal   Collection Time    09/30/13 10:45 PM      Result Value Range   WBC 14.4 (*) 4.0 - 10.5 K/uL   RBC 4.04  3.87 - 5.11 MIL/uL   Hemoglobin 12.3  12.0 - 15.0 g/dL   HCT 62.9 (*) 52.8 - 41.3 %   MCV 88.4  78.0 - 100.0 fL   MCH 30.4  26.0 - 34.0 pg   MCHC 34.5  30.0 - 36.0 g/dL   RDW 24.4  01.0 - 27.2 %   Platelets 182  150 - 400 K/uL    Imaging:  No results found. MAU Course: Cycle blood pressures, IV, Pepcid, CBC, CMET, NPO.  2252: Dr. Ambrose Mantle at bedside. IV labetalol and magnesium sulfate ordered.   Assessment: Severe Preeclampsia  Plan: Prep for OR per Dr. Ambrose Mantle.   [place], CNM 09/30/2013 11:58 PM

## 2013-10-01 ENCOUNTER — Encounter (HOSPITAL_COMMUNITY): Payer: Self-pay | Admitting: Anesthesiology

## 2013-10-01 ENCOUNTER — Encounter (HOSPITAL_COMMUNITY): Payer: 59 | Admitting: Anesthesiology

## 2013-10-01 ENCOUNTER — Inpatient Hospital Stay (HOSPITAL_COMMUNITY): Payer: 59 | Admitting: Anesthesiology

## 2013-10-01 ENCOUNTER — Encounter (HOSPITAL_COMMUNITY)
Admission: AD | Disposition: A | Discharge status: Home / Self Care | Payer: Self-pay | Source: Ambulatory Visit | Attending: Obstetrics and Gynecology

## 2013-10-01 DIAGNOSIS — O1410 Severe pre-eclampsia, unspecified trimester: Secondary | ICD-10-CM

## 2013-10-01 DIAGNOSIS — O1490 Unspecified pre-eclampsia, unspecified trimester: Secondary | ICD-10-CM | POA: Diagnosis present

## 2013-10-01 LAB — CBC
HCT: 32.3 % — ABNORMAL LOW (ref 36.0–46.0)
HCT: 30.1 % — ABNORMAL LOW (ref 36.0–46.0)
Hemoglobin: 11.1 g/dL — ABNORMAL LOW (ref 12.0–15.0)
Hemoglobin: 10.1 g/dL — ABNORMAL LOW (ref 12.0–15.0)
MCH: 30.2 pg (ref 26.0–34.0)
MCH: 29.5 pg (ref 26.0–34.0)
MCHC: 34.4 g/dL (ref 30.0–36.0)
MCV: 87.8 fL (ref 78.0–100.0)
Platelets: 67 10*3/uL — ABNORMAL LOW (ref 150–400)
RBC: 3.42 MIL/uL — ABNORMAL LOW (ref 3.87–5.11)
RDW: 14.1 % (ref 11.5–15.5)
WBC: 17 10*3/uL — ABNORMAL HIGH (ref 4.0–10.5)

## 2013-10-01 LAB — COMPREHENSIVE METABOLIC PANEL
ALT: 354 U/L — ABNORMAL HIGH (ref 0–35)
AST: 446 U/L — ABNORMAL HIGH (ref 0–37)
Alkaline Phosphatase: 155 U/L — ABNORMAL HIGH (ref 39–117)
Alkaline Phosphatase: 145 U/L — ABNORMAL HIGH (ref 39–117)
BUN: 15 mg/dL (ref 6–23)
BUN: 13 mg/dL (ref 6–23)
CO2: 22 mEq/L (ref 19–32)
CO2: 26 mEq/L (ref 19–32)
Calcium: 8.1 mg/dL — ABNORMAL LOW (ref 8.4–10.5)
Calcium: 7 mg/dL — ABNORMAL LOW (ref 8.4–10.5)
Creatinine, Ser: 0.67 mg/dL (ref 0.50–1.10)
GFR calc Af Amer: >90 mL/min (ref >90)
GFR calc Af Amer: >90 mL/min (ref >90)
GFR calc non Af Amer: >90 mL/min (ref >90)
Glucose, Bld: 101 mg/dL — ABNORMAL HIGH (ref 70–99)
Potassium: 4.5 mEq/L (ref 3.5–5.1)
Sodium: 134 mEq/L — ABNORMAL LOW (ref 135–145)
Sodium: 133 mEq/L — ABNORMAL LOW (ref 135–145)
Total Bilirubin: 0.8 mg/dL (ref 0.3–1.2)
Total Protein: 5.8 g/dL — ABNORMAL LOW (ref 6.0–8.3)
Total Protein: 5.7 g/dL — ABNORMAL LOW (ref 6.0–8.3)

## 2013-10-01 LAB — URINE CULTURE: Colony Count: NO GROWTH

## 2013-10-01 SURGERY — Surgical Case
Anesthesia: Spinal | Site: Abdomen

## 2013-10-01 MED ORDER — MORPHINE SULFATE (PF) 0.5 MG/ML IJ SOLN
INTRAMUSCULAR | Status: DC | PRN
  Administered 2013-10-01: .2 mg via INTRATHECAL

## 2013-10-01 MED ORDER — ONDANSETRON HCL 4 MG/2ML IJ SOLN: 4.0000 mg | Freq: Three times a day (TID) | INTRAMUSCULAR | Status: DC | PRN

## 2013-10-01 MED ORDER — LANOLIN HYDROUS EX OINT: 1.0000 "application " | TOPICAL_OINTMENT | CUTANEOUS | Status: DC | PRN

## 2013-10-01 MED ORDER — CEFAZOLIN SODIUM-DEXTROSE 2-3 GM-% IV SOLR
INTRAVENOUS | Status: AC
  Filled 2013-10-01: qty 50

## 2013-10-01 MED ORDER — CEFAZOLIN SODIUM-DEXTROSE 2-3 GM-% IV SOLR
INTRAVENOUS | Status: DC | PRN
  Administered 2013-10-01: 2 g via INTRAVENOUS

## 2013-10-01 MED ORDER — NALBUPHINE SYRINGE 5 MG/0.5 ML
5.0000 mg | INJECTION | INTRAMUSCULAR | Status: DC | PRN
  Filled 2013-10-01: qty 1

## 2013-10-01 MED ORDER — DIPHENHYDRAMINE HCL 25 MG PO CAPS: 25.0000 mg | ORAL_CAPSULE | Freq: Four times a day (QID) | ORAL | Status: DC | PRN

## 2013-10-01 MED ORDER — LORATADINE 10 MG PO TABS
10.0000 mg | ORAL_TABLET | Freq: Every day | ORAL | Status: DC
  Administered 2013-10-01 – 2013-10-04 (×3): 10 mg via ORAL
  Filled 2013-10-01 (×5): qty 1

## 2013-10-01 MED ORDER — FENTANYL CITRATE 0.05 MG/ML IJ SOLN
INTRAMUSCULAR | Status: DC | PRN
  Administered 2013-10-01: 12.5 ug via INTRATHECAL

## 2013-10-01 MED ORDER — NIFEDIPINE ER 30 MG PO TB24
30.0000 mg | ORAL_TABLET | Freq: Every day | ORAL | Status: DC
  Administered 2013-10-01: 30 mg via ORAL
  Filled 2013-10-01: qty 1

## 2013-10-01 MED ORDER — PROMETHAZINE HCL 25 MG/ML IJ SOLN: 6.2500 mg | INTRAMUSCULAR | Status: DC | PRN

## 2013-10-01 MED ORDER — SCOPOLAMINE 1 MG/3DAYS TD PT72
MEDICATED_PATCH | TRANSDERMAL | Status: AC
  Filled 2013-10-01: qty 1

## 2013-10-01 MED ORDER — BUPIVACAINE IN DEXTROSE 0.75-8.25 % IT SOLN
INTRATHECAL | Status: DC | PRN
  Administered 2013-10-01: 10.5 mg via INTRATHECAL

## 2013-10-01 MED ORDER — OXYTOCIN 40 UNITS IN LACTATED RINGERS INFUSION - SIMPLE MED: 62.5000 mL/h | INTRAVENOUS | Status: AC

## 2013-10-01 MED ORDER — IBUPROFEN 600 MG PO TABS: 600.0000 mg | ORAL_TABLET | Freq: Four times a day (QID) | ORAL | Status: DC | PRN

## 2013-10-01 MED ORDER — HYDROMORPHONE HCL PF 1 MG/ML IJ SOLN
0.2500 mg | INTRAMUSCULAR | Status: DC | PRN
  Administered 2013-10-01 (×2): 0.5 mg via INTRAVENOUS

## 2013-10-01 MED ORDER — ONDANSETRON HCL 4 MG/2ML IJ SOLN
INTRAMUSCULAR | Status: DC | PRN
  Administered 2013-10-01: 4 mg via INTRAVENOUS

## 2013-10-01 MED ORDER — DIPHENHYDRAMINE HCL 50 MG/ML IJ SOLN: 12.5000 mg | INTRAMUSCULAR | Status: DC | PRN

## 2013-10-01 MED ORDER — METOCLOPRAMIDE HCL 5 MG/ML IJ SOLN: 10.0000 mg | Freq: Three times a day (TID) | INTRAMUSCULAR | Status: DC | PRN

## 2013-10-01 MED ORDER — ONDANSETRON HCL 4 MG/2ML IJ SOLN: 4.0000 mg | INTRAMUSCULAR | Status: DC | PRN

## 2013-10-01 MED ORDER — HYDROMORPHONE HCL PF 1 MG/ML IJ SOLN
INTRAMUSCULAR | Status: AC
  Administered 2013-10-01: 0.5 mg via INTRAVENOUS
  Filled 2013-10-01: qty 1

## 2013-10-01 MED ORDER — OXYTOCIN 10 UNIT/ML IJ SOLN
INTRAMUSCULAR | Status: AC
  Filled 2013-10-01: qty 4

## 2013-10-01 MED ORDER — MEPERIDINE HCL 25 MG/ML IJ SOLN: 6.2500 mg | INTRAMUSCULAR | Status: DC | PRN

## 2013-10-01 MED ORDER — ONDANSETRON HCL 4 MG/2ML IJ SOLN
INTRAMUSCULAR | Status: AC
  Filled 2013-10-01: qty 2

## 2013-10-01 MED ORDER — SIMETHICONE 80 MG PO CHEW
80.0000 mg | CHEWABLE_TABLET | ORAL | Status: DC
  Administered 2013-10-02: 80 mg via ORAL
  Filled 2013-10-01: qty 1

## 2013-10-01 MED ORDER — WITCH HAZEL-GLYCERIN EX PADS: 1.0000 "application " | MEDICATED_PAD | CUTANEOUS | Status: DC | PRN

## 2013-10-01 MED ORDER — MEASLES, MUMPS & RUBELLA VAC ~~LOC~~ INJ
0.5000 mL | INJECTION | Freq: Once | SUBCUTANEOUS | Status: DC
  Filled 2013-10-01: qty 0.5

## 2013-10-01 MED ORDER — LACTATED RINGERS IV SOLN
INTRAVENOUS | Status: DC
  Administered 2013-10-01 – 2013-10-02 (×2): via INTRAVENOUS

## 2013-10-01 MED ORDER — LABETALOL HCL 5 MG/ML IV SOLN
20.0000 mg | Freq: Once | INTRAVENOUS | Status: AC
  Administered 2013-10-01: 20 mg via INTRAVENOUS
  Filled 2013-10-01: qty 4

## 2013-10-01 MED ORDER — SODIUM CHLORIDE 0.9 % IJ SOLN
3.0000 mL | INTRAMUSCULAR | Status: DC | PRN
  Administered 2013-10-03: 3 mL via INTRAVENOUS

## 2013-10-01 MED ORDER — SENNOSIDES-DOCUSATE SODIUM 8.6-50 MG PO TABS
2.0000 | ORAL_TABLET | ORAL | Status: DC
  Administered 2013-10-02 – 2013-10-03 (×3): 2 via ORAL
  Filled 2013-10-01 (×3): qty 2

## 2013-10-01 MED ORDER — DIBUCAINE 1 % RE OINT: 1.0000 "application " | TOPICAL_OINTMENT | RECTAL | Status: DC | PRN

## 2013-10-01 MED ORDER — CEFAZOLIN SODIUM 1-5 GM-% IV SOLN
1.0000 g | Freq: Three times a day (TID) | INTRAVENOUS | Status: AC
  Administered 2013-10-01 (×2): 1 g via INTRAVENOUS
  Filled 2013-10-01 (×2): qty 50

## 2013-10-01 MED ORDER — MENTHOL 3 MG MT LOZG: 1.0000 | LOZENGE | OROMUCOSAL | Status: DC | PRN

## 2013-10-01 MED ORDER — ZOLPIDEM TARTRATE 5 MG PO TABS: 5.0000 mg | ORAL_TABLET | Freq: Every evening | ORAL | Status: DC | PRN

## 2013-10-01 MED ORDER — SCOPOLAMINE 1 MG/3DAYS TD PT72
1.0000 | MEDICATED_PATCH | Freq: Once | TRANSDERMAL | Status: AC
  Administered 2013-10-01: 1.5 mg via TRANSDERMAL

## 2013-10-01 MED ORDER — DIPHENHYDRAMINE HCL 50 MG/ML IJ SOLN: 25.0000 mg | INTRAMUSCULAR | Status: DC | PRN

## 2013-10-01 MED ORDER — DIPHENHYDRAMINE HCL 25 MG PO CAPS: 25.0000 mg | ORAL_CAPSULE | ORAL | Status: DC | PRN

## 2013-10-01 MED ORDER — TETANUS-DIPHTH-ACELL PERTUSSIS 5-2.5-18.5 LF-MCG/0.5 IM SUSP
0.5000 mL | Freq: Once | INTRAMUSCULAR | Status: DC
  Filled 2013-10-01: qty 0.5

## 2013-10-01 MED ORDER — MEPERIDINE HCL 25 MG/ML IJ SOLN
INTRAMUSCULAR | Status: AC
  Filled 2013-10-01: qty 1

## 2013-10-01 MED ORDER — NALOXONE HCL 0.4 MG/ML IJ SOLN: 0.4000 mg | INTRAMUSCULAR | Status: DC | PRN

## 2013-10-01 MED ORDER — FENTANYL CITRATE 0.05 MG/ML IJ SOLN
INTRAMUSCULAR | Status: AC
  Filled 2013-10-01: qty 2

## 2013-10-01 MED ORDER — LABETALOL HCL 200 MG PO TABS
200.0000 mg | ORAL_TABLET | Freq: Two times a day (BID) | ORAL | Status: DC
  Administered 2013-10-01: 200 mg via ORAL
  Filled 2013-10-01 (×2): qty 1

## 2013-10-01 MED ORDER — MORPHINE SULFATE 0.5 MG/ML IJ SOLN
INTRAMUSCULAR | Status: AC
  Filled 2013-10-01: qty 10

## 2013-10-01 MED ORDER — MAGNESIUM SULFATE 40 G IN LACTATED RINGERS - SIMPLE
2.0000 g/h | INTRAVENOUS | Status: DC
  Administered 2013-10-01 – 2013-10-02 (×2): 2 g/h via INTRAVENOUS
  Filled 2013-10-01 (×3): qty 500

## 2013-10-01 MED ORDER — OXYCODONE-ACETAMINOPHEN 5-325 MG PO TABS
1.0000 | ORAL_TABLET | ORAL | Status: DC | PRN
  Administered 2013-10-01 – 2013-10-02 (×3): 2 via ORAL
  Administered 2013-10-02: 1 via ORAL
  Administered 2013-10-02 – 2013-10-04 (×8): 2 via ORAL
  Filled 2013-10-01 (×11): qty 2
  Filled 2013-10-01: qty 1
  Filled 2013-10-01: qty 2

## 2013-10-01 MED ORDER — SIMETHICONE 80 MG PO CHEW
80.0000 mg | CHEWABLE_TABLET | ORAL | Status: DC | PRN
  Administered 2013-10-02: 80 mg via ORAL
  Filled 2013-10-01: qty 1

## 2013-10-01 MED ORDER — KETOROLAC TROMETHAMINE 30 MG/ML IJ SOLN
15.0000 mg | Freq: Once | INTRAMUSCULAR | Status: AC | PRN
  Administered 2013-10-01: 30 mg via INTRAVENOUS

## 2013-10-01 MED ORDER — 0.9 % SODIUM CHLORIDE (POUR BTL) OPTIME
TOPICAL | Status: DC | PRN
  Administered 2013-10-01: 1000 mL

## 2013-10-01 MED ORDER — KETOROLAC TROMETHAMINE 30 MG/ML IJ SOLN
INTRAMUSCULAR | Status: AC
  Filled 2013-10-01: qty 1

## 2013-10-01 MED ORDER — LABETALOL HCL 100 MG PO TABS
100.0000 mg | ORAL_TABLET | Freq: Two times a day (BID) | ORAL | Status: DC
  Administered 2013-10-01: 100 mg via ORAL
  Filled 2013-10-01 (×3): qty 1

## 2013-10-01 MED ORDER — PRENATAL MULTIVITAMIN CH
1.0000 | ORAL_TABLET | Freq: Every day | ORAL | Status: AC
  Administered 2013-10-01 – 2013-10-03 (×3): 1 via ORAL
  Filled 2013-10-01 (×3): qty 1

## 2013-10-01 MED ORDER — NALOXONE HCL 1 MG/ML IJ SOLN
1.0000 ug/kg/h | INTRAVENOUS | Status: DC | PRN
  Filled 2013-10-01: qty 2

## 2013-10-01 MED ORDER — KETOROLAC TROMETHAMINE 30 MG/ML IJ SOLN: 30.0000 mg | Freq: Four times a day (QID) | INTRAMUSCULAR | Status: AC | PRN

## 2013-10-01 MED ORDER — LABETALOL HCL 5 MG/ML IV SOLN
10.0000 mg | Freq: Once | INTRAVENOUS | Status: AC
  Administered 2013-10-01: 10 mg via INTRAVENOUS
  Filled 2013-10-01: qty 4

## 2013-10-01 MED ORDER — MEPERIDINE HCL 25 MG/ML IJ SOLN
INTRAMUSCULAR | Status: DC | PRN
  Administered 2013-10-01: 25 mg via INTRAVENOUS

## 2013-10-01 MED ORDER — ONDANSETRON HCL 4 MG PO TABS: 4.0000 mg | ORAL_TABLET | ORAL | Status: DC | PRN

## 2013-10-01 MED ORDER — OXYTOCIN 10 UNIT/ML IJ SOLN
40.0000 [IU] | INTRAVENOUS | Status: DC | PRN
  Administered 2013-10-01: 40 [IU] via INTRAVENOUS

## 2013-10-01 MED ORDER — SIMETHICONE 80 MG PO CHEW
80.0000 mg | CHEWABLE_TABLET | Freq: Three times a day (TID) | ORAL | Status: DC
  Administered 2013-10-01 – 2013-10-04 (×10): 80 mg via ORAL
  Filled 2013-10-01 (×10): qty 1

## 2013-10-01 MED ORDER — IBUPROFEN 600 MG PO TABS: 600.0000 mg | ORAL_TABLET | Freq: Four times a day (QID) | ORAL | Status: DC

## 2013-10-01 SURGICAL SUPPLY — 32 items
CLAMP CORD UMBIL (MISCELLANEOUS) ×2 IMPLANT
CLOTH BEACON ORANGE TIMEOUT ST (SAFETY) ×2 IMPLANT
CONTAINER PREFILL 10% NBF 15ML (MISCELLANEOUS) IMPLANT
DRAPE LG THREE QUARTER DISP (DRAPES) ×2 IMPLANT
DRSG OPSITE POSTOP 4X10 (GAUZE/BANDAGES/DRESSINGS) ×2 IMPLANT
DRSG VASELINE 3X18 (GAUZE/BANDAGES/DRESSINGS) IMPLANT
DURAPREP 26ML APPLICATOR (WOUND CARE) ×2 IMPLANT
ELECT REM PT RETURN 9FT ADLT (ELECTROSURGICAL) ×2
ELECTRODE REM PT RTRN 9FT ADLT (ELECTROSURGICAL) ×1 IMPLANT
EXTRACTOR VACUUM KIWI (MISCELLANEOUS) ×2 IMPLANT
EXTRACTOR VACUUM M CUP 4 TUBE (SUCTIONS) IMPLANT
GLOVE BIO SURGEON STRL SZ7.5 (GLOVE) ×2 IMPLANT
GOWN PREVENTION PLUS XLARGE (GOWN DISPOSABLE) ×2 IMPLANT
GOWN STRL REIN XL XLG (GOWN DISPOSABLE) ×2 IMPLANT
KIT ABG SYR 3ML LUER SLIP (SYRINGE) IMPLANT
NEEDLE HYPO 25X5/8 SAFETYGLIDE (NEEDLE) IMPLANT
NS IRRIG 1000ML POUR BTL (IV SOLUTION) ×2 IMPLANT
PACK C SECTION WH (CUSTOM PROCEDURE TRAY) ×2 IMPLANT
PAD OB MATERNITY 4.3X12.25 (PERSONAL CARE ITEMS) ×2 IMPLANT
RTRCTR C-SECT PINK 25CM LRG (MISCELLANEOUS) IMPLANT
SLEEVE SCD COMPRESS KNEE MED (MISCELLANEOUS) ×2 IMPLANT
STAPLER VISISTAT 35W (STAPLE) ×2 IMPLANT
SUT PLAIN 0 NONE (SUTURE) IMPLANT
SUT VIC AB 0 CT1 36 (SUTURE) ×12 IMPLANT
SUT VIC AB 3-0 CTX 36 (SUTURE) ×2 IMPLANT
SUT VIC AB 3-0 SH 27 (SUTURE)
SUT VIC AB 3-0 SH 27X BRD (SUTURE) IMPLANT
SUT VIC AB 4-0 KS 27 (SUTURE) IMPLANT
SUT VICRYL 0 TIES 12 18 (SUTURE) IMPLANT
TOWEL OR 17X24 6PK STRL BLUE (TOWEL DISPOSABLE) ×2 IMPLANT
TRAY FOLEY CATH 14FR (SET/KITS/TRAYS/PACK) ×2 IMPLANT
WATER STERILE IRR 1000ML POUR (IV SOLUTION) IMPLANT

## 2013-10-01 NOTE — Anesthesia Postprocedure Evaluation (Signed)
Anesthesia Post Note  Patient: Sherry Cobb  Procedure(s) Performed: Procedure(s) (LRB): Primary Cesarean Section Delivery Baby Girl @ [phone], Apgars 5/7 (N/A)  Anesthesia type: Spinal  Patient location: PACU  Post pain: Pain level controlled  Post assessment: Post-op Vital signs reviewed  Last Vitals:  Filed Vitals:   10/01/13 0145  BP: 140/88  Pulse: 88  Temp:   Resp: 32    Post vital signs: Reviewed  Level of consciousness: awake  Complications: No apparent anesthesia complications

## 2013-10-01 NOTE — Anesthesia Postprocedure Evaluation (Signed)
Anesthesia Post Note  Patient: KIYLAH LOYER  Procedure(s) Performed: Procedure(s) (LRB): Primary Cesarean Section Delivery Baby Girl @ [phone], Apgars 5/7 (N/A)  Anesthesia type: Spinal  Patient location: Mother/Baby  Post pain: Pain level controlled  Post assessment: Post-op Vital signs reviewed  Last Vitals:  Filed Vitals:   10/01/13 1300  BP: 153/105  Pulse:   Temp:   Resp:     Post vital signs: Reviewed  Level of consciousness:alert  Complications: No apparent anesthesia complications

## 2013-10-01 NOTE — Anesthesia Procedure Notes (Signed)
Spinal  Patient location during procedure: OR Start time: 10/01/2013 12:21 AM End time: 10/01/2013 12:25 AM Staffing Anesthesiologist: Leilani Able Performed by: anesthesiologist  Preanesthetic Checklist Completed: patient identified, surgical consent, pre-op evaluation, timeout performed, IV checked, risks and benefits discussed and monitors and equipment checked Spinal Block Patient position: sitting Prep: DuraPrep Patient monitoring: heart rate, cardiac monitor, continuous pulse ox and blood pressure Approach: midline Location: L3-4 Injection technique: single-shot Needle Needle type: Sprotte  Needle gauge: 24 G Needle length: 9 cm Assessment Sensory level: T6

## 2013-10-01 NOTE — Op Note (Signed)
NAMEMarland Cobb  AFTEN, LIPSEY NO.:  [id number]  MEDICAL RECORD NO.:  [id number]  LOCATION:  [facility]                          FACILITY:  [facility]  PHYSICIAN:  Malachi Pro. Ambrose Mantle, M.D. DATE OF BIRTH:  06-28-1986  DATE OF PROCEDURE:  10/01/2013 DATE OF DISCHARGE:                              OPERATIVE REPORT   PREOPERATIVE DIAGNOSIS:  Intrauterine pregnancy, 33 weeks and 5 days, severe preeclampsia.  POSTOPERATIVE DIAGNOSIS:  Intrauterine pregnancy, 33 weeks and 5 days, severe preeclampsia.  OPERATION:  Low-transverse cervical C-section.  OPERATOR:  Malachi Pro. Ambrose Mantle, M.D.  SPINAL ANESTHESIA:  Quillian Quince, M.D.  DESCRIPTION OF PROCEDURE:  The patient was brought to the operating room and given a spinal anesthetic by Dr. Arby Barrette.  The RN placed a Foley catheter, and prepped the abdomen with DuraPrep, 3 minutes were allowed for it to dry.  A time-out was done, and then the abdomen was draped as a sterile field.  Anesthesia was confirmed and a transverse incision was made and carried in layers through the skin, subcutaneous tissue, and fascia.  The fascia was separated from the rectus muscles superiorly and inferiorly.  The peritoneum was opened and enlarged.  The lower uterine segment was exposed with a Engineer, manufacturing.  A short incision was made in the inferior portion of the uterus in the lower uterine segment through the superficial layers of the myometrium.  I entered the amniotic sac with my finger.  I enlarged the incision by pulling superiorly and inferiorly, and then pulled the vertex into the incisional opening, but in spite of fundal pressure and trying to manipulate the head through the incisional opening, I could not quite get the head to deliver, so I placed a Kiwi vacuum into the green zone and with very, very gentle pressure, I lifted the baby's head out in a gradual lift so that there was no pressure at all placed on the baby's head.  After the head was  delivered, the vacuum was released.  The nose and pharynx was suctioned.  The baby cried.  The cord was clamped.  The infant was given to Dr. Ruben Gottron, who was in attendance and assigned 3 pounds 14 ounce female infant.  Apgars of 5 at 1 minute and 7 at 5 minutes.  The placenta was removed intact.  The inside of the uterus was inspected and found to be free of any placental fragments.  The cervix was dilated and the uterine incision was closed with 2 running sutures of 0 Vicryl locking the first layer and using locking and nonlocking on the second layer.  One additional figure-of-eight suture was required for hemostasis.  Liberal irrigation confirmed hemostasis.  Both tubes and ovaries appeared normal.  The uterus was normal.  The gutters were blotted free of blood and the abdominal wall was closed in layers using interrupted sutures of 0 Vicryl to close the rectus muscle and peritoneum in 1 layer with 2 running sutures of 0 Vicryl on the fascia, a running 3-0 Vicryl on the subcutaneous tissue, and staples on the skin.  The patient seemed to tolerate the procedure well.  Blood loss was about 1000 mL.  Sponge  and needle counts were correct and she was returned to recovery in satisfactory condition.     Malachi Pro. Ambrose Mantle, M.D.     TFH/MEDQ  D:  10/01/2013  T:  10/01/2013  Job:  [id number]

## 2013-10-01 NOTE — H&P (Signed)
NAMEMarland Kitchen  Sherry Cobb, Sherry Cobb NO.:  [id number]  MEDICAL RECORD NO.:  [id number]  LOCATION:  [facility]                          [facility]AC[facility]:  [facility]  PHYSICIAN:  Malachi Pro. Ambrose Mantle, M.D. DATE OF BIRTH:  26-Jan-1986  DATE OF ADMISSION:  10/01/2013 DATE OF DISCHARGE:                             HISTORY & PHYSICAL   HISTORY OF PRESENT ILLNESS:  A 27 year old white female, para 0, gravida 1, with [facility] November 14, 2013 admitted for C-section for severe preeclampsia.  Blood group and type A positive.  Negative antibody.  Pap smear normal.  Rubella immune.  RPR nonreactive.  Urine culture negative.  Hepatitis B surface antigen negative.  HIV negative.  GC and Chlamydia negative.  Cystic fibrosis negative.  First trimester screen negative.  Second trimester screen and AFP only negative.  1-hour Glucola 108.  Group B strep has not been done.  The patient was well until September 06, 2013, when she was noted to have elevated blood pressure with normal labs.  She did receive steroids.  She has been tested twice weekly with nonstress test and she did have 340 mg of protein in a 24-hour urine recently.  She was fine until approximately 8 p.m. on September 30, 2013, when she began having severe pain beneath her xiphoid.  She took her blood pressure is 190s/110s.  She came to the hospital and blood pressures were confirmed to be that high and subsequently went to 210/120.  She was treated with 3 doses of labetalol IV with minimal improvement in her blood pressure.  Her urinalysis showed 300 mg percent of protein and 12 hours before showed 100 mg percent.  SGOT and PT were 15 and 13 early in the morning of September 30, 2013, and 12 hours later were 43 and 32.  Her platelet count actually improved from 160-180.  The patient's subxiphoid pain did not improve with labetalol, magnesium sulfate, or Pepcid.  She is admitted after consultation with Dr. Particia Nearing for C-section because her cervix is long and  closed.  PAST MEDICAL HISTORY:  Reveals psoriasis since a child.  She has had GERD, gastritis, kidney stones.  SOCIAL HISTORY:  She has never smoked.  She does not drink alcohol. 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  SURGICAL HISTORY:  Nose surgery for deviated septum in March 2013.  ALLERGIES:  She does not have any allergies.  FAMILY HISTORY:  Mother with high blood pressure and hypercholesterolemia.  Father with high blood pressure and hypercholesterolemia and malignant tumor of the kidney at age 74. Sister with gestational diabetes mellitus and proteinuria.  Maternal grandmother with heart disease and glaucoma.  Maternal grandfather, heart disease.  Paternal grandmother with heart disease and diabetes. Paternal grandfather with heart disease.  PHYSICAL EXAMINATION:  VITAL SIGNS:  On admission, temperature 98.4, pulse 78, respirations 20, blood pressure 188/113 after 3 doses of IV labetalol. HEAD, EYES, NOSE, AND THROAT:  Normal. LUNGS:  Clear to auscultation. HEART:  Normal size and sounds.  No murmurs. PELVIC:  At her last prenatal visit, the fundal height was 32 cm.  Fetal heart tones are normal.  The cervix  is long and closed.  The vertex is presenting.  ADMITTING IMPRESSION:  Intrauterine pregnancy at 33 weeks and 5 days, severe preeclampsia.  The patient is admitted for C-section because the blood pressure has been minimally affected with IV labetalol and her cervix is long and closed.     Malachi Pro. Ambrose Mantle, M.D.     TFH/MEDQ  D:  10/01/2013  T:  10/01/2013  Job:  [id number]

## 2013-10-01 NOTE — Progress Notes (Signed)
Jakhiya was in good spirits, despite all that she had been through in the past 24 hours.  They are delighted to have their daughter and relieved that she seems to be doing okay. Catalia did not express concern about her own medical condition, and appeared to be coping well.    She is aware of on-going chaplain availability.  Please also page as needs arise, [phone]  Kathleen Argue 12:49 PM   10/01/13 1200  Clinical Encounter Type  Visited With Patient and family together  Visit Type Spiritual support  Referral From Nurse;Care management

## 2013-10-01 NOTE — Progress Notes (Signed)
Patient ID: Sherry Cobb, female   DOB: Jul 26, 1986, 27 y.o.   MRN: [id number] DOS Afebrile BP slightly elevated Pt feels improved. Over the last 24 hours the ALT has gone from [phone], the ALT has gone from 13-32-529 and the platelet count has gone from [phone]. Will continue Magnesium sulfate until LFT's are markedly improved. Her abdomen is soft and not tender. Output is good

## 2013-10-01 NOTE — Anesthesia Preprocedure Evaluation (Addendum)
Anesthesia Evaluation  Patient identified by MRN, date of birth, ID band Patient awake    Reviewed: Allergy & Precautions, H&P , NPO status , Patient's Chart, lab work & pertinent test results, reviewed documented beta blocker date and time   Airway Mallampati: I TM Distance: >3 FB Neck ROM: full    Dental no notable dental hx.    Pulmonary neg pulmonary ROS,  breath sounds clear to auscultation  Pulmonary exam normal       Cardiovascular hypertension,  Severe PIH   Neuro/Psych negative neurological ROS  negative psych ROS   GI/Hepatic negative GI ROS, Neg liver ROS,   Endo/Other  negative endocrine ROS  Renal/GU      Musculoskeletal   Abdominal Normal abdominal exam  (+)   Peds  Hematology negative hematology ROS (+)   Anesthesia Other Findings   Reproductive/Obstetrics (+) Pregnancy                           Anesthesia Physical Anesthesia Plan  ASA: III and emergent  Anesthesia Plan: Spinal   Post-op Pain Management:    Induction:   Airway Management Planned:   Additional Equipment:   Intra-op Plan:   Post-operative Plan:   Informed Consent: I have reviewed the patients History and Physical, chart, labs and discussed the procedure including the risks, benefits and alternatives for the proposed anesthesia with the patient or authorized representative who has indicated his/her understanding and acceptance.     Plan Discussed with: CRNA and Surgeon  Anesthesia Plan Comments:         Anesthesia Quick Evaluation

## 2013-10-01 NOTE — Progress Notes (Signed)
UR chart review completed.  

## 2013-10-01 NOTE — Transfer of Care (Signed)
Immediate Anesthesia Transfer of Care Note  Patient: Sherry Cobb  Procedure(s) Performed: Procedure(s): Primary Cesarean Section Delivery Baby Girl @ [phone], Apgars 5/7 (N/A)  Patient Location: PACU  Anesthesia Type:Spinal  Level of Consciousness: awake, alert , oriented and patient cooperative  Airway & Oxygen Therapy: Patient Spontanous Breathing  Post-op Assessment: Report given to PACU RN and Post -op Vital signs reviewed and stable  Post vital signs: Reviewed and stable  Complications: No apparent anesthesia complications

## 2013-10-02 LAB — CBC
Hemoglobin: 10.3 g/dL — ABNORMAL LOW (ref 12.0–15.0)
MCH: 30.5 pg (ref 26.0–34.0)
MCHC: 34.4 g/dL (ref 30.0–36.0)
MCV: 88.5 fL (ref 78.0–100.0)
Platelets: 65 10*3/uL — ABNORMAL LOW (ref 150–400)
RBC: 3.38 MIL/uL — ABNORMAL LOW (ref 3.87–5.11)
WBC: 14.7 10*3/uL — ABNORMAL HIGH (ref 4.0–10.5)

## 2013-10-02 LAB — COMPREHENSIVE METABOLIC PANEL
ALT: 256 U/L — ABNORMAL HIGH (ref 0–35)
AST: 223 U/L — ABNORMAL HIGH (ref 0–37)
Albumin: 2.3 g/dL — ABNORMAL LOW (ref 3.5–5.2)
Alkaline Phosphatase: 143 U/L — ABNORMAL HIGH (ref 39–117)
CO2: 28 mEq/L (ref 19–32)
Calcium: 6.8 mg/dL — ABNORMAL LOW (ref 8.4–10.5)
Chloride: 100 mEq/L (ref 96–112)
GFR calc Af Amer: >90 mL/min (ref >90)
GFR calc non Af Amer: >90 mL/min (ref >90)
Glucose, Bld: 80 mg/dL (ref 70–99)
Potassium: 3.9 mEq/L (ref 3.5–5.1)
Sodium: 135 mEq/L (ref 135–145)
Total Bilirubin: 0.5 mg/dL (ref 0.3–1.2)

## 2013-10-02 MED ORDER — FENTANYL CITRATE 0.05 MG/ML IJ SOLN
INTRAMUSCULAR | Status: AC
  Filled 2013-10-02: qty 2

## 2013-10-02 NOTE — Progress Notes (Signed)
10/02/13 1051  Vitals  BP 107/65 mmHg  MAP (mmHg) 73  Pulse Rate ! 107  Resp ! 24  Oxygen Therapy  SpO2 96 %  just returned to the side of the bed, after standing up brushing teeth and changing peri pad. Pt c/o "being dizzy" and "hot". Will try to ambulate in room later and remove foley at that point.

## 2013-10-02 NOTE — Progress Notes (Signed)
10/02/13 1200  Vitals  BP 117/79 mmHg  MAP (mmHg) 87  notified Dr. Jackelyn Knife of recent BP's and holding parameters for Labetalol. See new orders.

## 2013-10-02 NOTE — Progress Notes (Signed)
Subjective: Postpartum Day #1: Cesarean Delivery Patient reports incisional pain and tolerating PO.    Objective: Vital signs in last 24 hours: Temp:  [97.9 F (36.6 C)-98.5 F (36.9 C)] 98.4 F (36.9 C) (12/27 0430) Pulse Rate:  [77-96] 96 (12/27 0800) Resp:  [16-20] 18 (12/27 0800) BP: (117-168)/(75-105) 131/86 mmHg (12/27 0800) SpO2:  [94 %-100 %] 100 % (12/27 0800)  Physical Exam:  General: alert Lochia: appropriate Uterine Fundus: firm Incision: dressing C/D/I    Recent Labs  10/01/13 1703 10/02/13 0517  HGB 10.1* 10.3*  HCT 30.1* 29.9*  LFTs improving Platelets stable in 60K region  Assessment/Plan: Status post Cesarean section. Postoperative course complicated by severe preeclampsia, BP is normal on Labetalol and Procardia, good UOP, LFTs improving, platelets stable Continue current care, d/c Procardia as BP is good and still on Magnesium.  Will continue magnesium today, hopefully d/c it in am, recheck labs in am, d/c foley.Marland Kitchen  Soni Kegel D 10/02/2013, 9:03 AM

## 2013-10-02 NOTE — Progress Notes (Signed)
Clinical Social Work Department PSYCHOSOCIAL ASSESSMENT - MATERNAL/CHILD 10/02/2013  Patient:  Sherry Cobb,Sherry Cobb  Account Number:  [id number]  Admit Date:  09/30/2013  Childs Name:   Emerson Dolney    Clinical Social Worker:  Kori Colin, LCSW   Date/Time:  10/02/2013 11:45 AM  Date Referred:  10/02/2013   Referral source  NICU     Referred reason  NICU   Other referral source:    I:  FAMILY / HOME ENVIRONMENT Child'Cobb legal guardian:  PARENT  Guardian - Name Guardian - Age Guardian - Address  Sherry Cobb,Sherry Cobb 27 [address]  [place], [place] [address]  Sherry Cobb, Sherry Cobb  same as above   Other household support members/support persons Other support:   Extensive frmily support    II  PSYCHOSOCIAL DATA Information Source:    Financial and Community Resources Employment:   Both parents employed   Financial resources:  Private Insurance If Medicaid - County:    School / Grade:   Maternity Care Coordinator / Child Services Coordination / Early Interventions:  Cultural issues impacting care:    III  STRENGTHS Strengths  Supportive family/friends  Home prepared for Child (including basic supplies)  Adequate Resources  Understanding of illness   Strength comment:    IV  RISK FACTORS AND CURRENT PROBLEMS Current Problem:       V  SOCIAL WORK ASSESSMENT Met with both parents.  They were pleasant and receptive to social work intervention.  Parents are married.  They have no other dependents.   Both parents are employed and mother reports plan to return to work.  Both parents seems to be coping well with newborn NICU admission.  Informed that they have spoken with the medical team and was told that their baby is doing better today.  Parents communicate hopes that infant will continue to do well.  Mother denies any hx of substance abuse or mental illness.  No acute social concerns related at this time.        VI SOCIAL WORK PLAN Social Work Plan  Psychosocial  Support/Ongoing Assessment of Needs   Sherry Chesmore J, LCSW  

## 2013-10-03 LAB — CBC
HCT: 30.4 % — ABNORMAL LOW (ref 36.0–46.0)
Hemoglobin: 10.1 g/dL — ABNORMAL LOW (ref 12.0–15.0)
MCH: 29.7 pg (ref 26.0–34.0)
RBC: 3.4 MIL/uL — ABNORMAL LOW (ref 3.87–5.11)
WBC: 20.3 10*3/uL — ABNORMAL HIGH (ref 4.0–10.5)

## 2013-10-03 LAB — COMPREHENSIVE METABOLIC PANEL
AST: 74 U/L — ABNORMAL HIGH (ref 0–37)
CO2: 29 mEq/L (ref 19–32)
Calcium: 7.2 mg/dL — ABNORMAL LOW (ref 8.4–10.5)
Chloride: 98 mEq/L (ref 96–112)
Creatinine, Ser: 0.69 mg/dL (ref 0.50–1.10)
GFR calc Af Amer: >90 mL/min (ref >90)
GFR calc non Af Amer: >90 mL/min (ref >90)
Glucose, Bld: 97 mg/dL (ref 70–99)
Total Bilirubin: 0.3 mg/dL (ref 0.3–1.2)
Total Protein: 6.1 g/dL (ref 6.0–8.3)

## 2013-10-03 MED ORDER — LABETALOL HCL 100 MG PO TABS
100.0000 mg | ORAL_TABLET | Freq: Two times a day (BID) | ORAL | Status: DC
  Administered 2013-10-03 – 2013-10-04 (×3): 100 mg via ORAL
  Filled 2013-10-03 (×5): qty 1

## 2013-10-03 NOTE — Progress Notes (Signed)
POD #2 Feels ok, no problems Afeb, VSS, BP 130-140/70-90 off meds Abd- soft, fundus firm, incision intact Labs with improved LFTs and platelets Will d/c magnesium, continue routine care, monitor BP off meds

## 2013-10-03 NOTE — Lactation Note (Addendum)
Lactation Consultation Note Spoke with mother on the phone after call from [facility] and [place] [facility].  Mother had been complaining of breast fullness.  Had been instructed earlier to use warmth to relieve discomfort and not to pump during the night.  Mother still complaining of increased fullness and stronger pain.  States warmth has not helped.   Reviewed engorgement care applying cold packs for 20 minutes every 2-3 hours.  Advised Continuing to pump two more times tonight until breasts feel less full and apply ice afterwards on top and bottom of breasts.  Will continue to monitor and lactation will follow up tomorrow.      Patient Name: Sherry Cobb Date: 10/03/2013     Maternal Data    Feeding    LATCH Score/Interventions                      Lactation Tools Discussed/Used     Consult Status      Dahlia Byes [facility] 10/03/2013, 9:47 PM

## 2013-10-03 NOTE — Lactation Note (Signed)
This note was copied from the chart of Sherry Cobb. Lactation Consultation Note:  Attempt for follow up visit with mom, She is in NICU visiting baby. RN reports that mom is pumping 15- 20 cc's per pumping. Will F/U tomorrow  Patient Name: Sherry Cobb [id number] Date: 10/03/2013     Maternal Data    Feeding   LATCH Score/Interventions                      Lactation Tools Discussed/Used     Consult Status      Sherry Cobb 10/03/2013, 3:13 PM

## 2013-10-04 ENCOUNTER — Encounter (HOSPITAL_COMMUNITY): Payer: Self-pay | Admitting: Obstetrics and Gynecology

## 2013-10-04 MED ORDER — OXYCODONE-ACETAMINOPHEN 5-325 MG PO TABS: 1.0000 | ORAL_TABLET | Freq: Four times a day (QID) | ORAL | Status: DC | PRN

## 2013-10-04 MED ORDER — IBUPROFEN 600 MG PO TABS: 600.0000 mg | ORAL_TABLET | Freq: Four times a day (QID) | ORAL | Status: DC | PRN

## 2013-10-04 NOTE — Progress Notes (Signed)
Out in wheelchair   Teaching complete  

## 2013-10-04 NOTE — Progress Notes (Signed)
Patient ID: Sherry Cobb, female   DOB: [birth year], 27 y.o.   MRN: [id number] #3 afebrile Tolerating a regular diet , passing flatus, voiding well and ambulating well. No epigastric pain or headache. No labs this AM For d/c

## 2013-10-04 NOTE — Lactation Note (Signed)
This note was copied from the chart of Sherry Cobb. Lactation Consultation Note  Bilateral breast engorgement noted this AM, left worse than right.  Mom did not pump during the night and pumped once this AM 3 hours ago.  She obtained 40-60 mls from each breast.  Engorgement treatment reviewed with patient.  Ice packs applied to breasts for 20 minutes and then assisted with pumping x 20 minutes.  Milk flow is good and some softening noted.  Reviewed plan for today to pump every 2 hours 15-20 minutes or until milk stops flowing.  Mom will ice breasts 20 minutes prior to and after pumpings.  Instructed to pump every 3 hours during the night.  Mom has medela pump in style for home use.  Instructed to bring pump pieces with her when visiting baby in NICU.  Patient Name: Sherry Alecia Doi [id number] Date: 10/04/2013     Maternal Data    Feeding Feeding Type: Breast Milk Nipple Type: Slow - flow Length of feed: 20 min  LATCH Score/Interventions                      Lactation Tools Discussed/Used     Consult Status      Hansel Feinstein 10/04/2013, 10:38 AM

## 2013-10-05 NOTE — Discharge Summary (Signed)
NAMEMarland Kitchen  Sherry, Cobb NO.:  [id number]  MEDICAL RECORD NO.:  [id number]  LOCATION:  9320                          [facility]:  [facility]  PHYSICIAN:  Malachi Pro. Ambrose Mantle, M.D. DATE OF BIRTH:  1986/09/20  DATE OF ADMISSION:  09/30/2013 DATE OF DISCHARGE:  10/04/2013                              DISCHARGE SUMMARY   HISTORY OF PRESENT ILLNESS:  This is a 27 year old white female, para 0, gravida 1, who was admitted through the maternity admission unit for C- section because of severe preeclampsia and suspected HELLPs.  She has been fine except for diagnosis of preeclampsia until 8 p.m. on the evening of September 30, 2013, and her blood pressure was in the 190s over 110s.  She came to the hospital and subsequently the blood pressure went to 210/120.  Her labs were drawn twice on September 30, 2013. Initially, the SGOT and SGPT were 15 and 13 and in the evening were 43 and 32.  Her urine protein has increased from 100-300 mg%.  I consulted with Dr. Particia Nearing because of her long closed cervix and felt a C- section is indicated.  She agreed.  The patient did not respond to IV of labetalol.  She was taken to the operating room and underwent a low- transverse cervical C-section, delivery of a 3 pounds 14 ounce female infant, had Apgars of 5 and 7 at 1 and 5 minutes.  Postpartum, the patient did well.  She was initially treated with labetalol and Procardia but the Procardia was discontinued because the blood pressure actually remained relatively okay but she was dizzy when she got up.  In the last 24 hours without Procardia labetalol 100 twice a day her blood pressures have ranged from 139/82 to 173/95.  Her labs prior to delivery, SGOT and SGPT were 43 and 32.  That morning after delivery they were 823 and 529, that evening they were 446 and 354.  First postop day 223 and 256, and on the second postop day 74 and 161.  Her platelet count was 182 on admission up from 96, 67, 65 and  back to 85.  Her blood sugars were normal.  FINAL DIAGNOSES:  Intrauterine pregnancy at 33 weeks and 5 days with severe preeclampsia and HELLP syndrome.  OPERATION:  Low-transverse cervical C-section.  FINAL CONDITION:  Improved.  INSTRUCTIONS:  Include our regular discharge instruction booklet as well as the after visit summary.  She is also given prescriptions for Motrin 600 mg 30 tablets, 1 every 6 hours as needed for pain, labetalol 100 mg 1 every 12 hours, and Motrin 600 mg 30 tablets, 1 every 6 hours as needed.  She is to return to the office in 4-7 days to have her staples removed.  Her BUN and creatinine were always normal ranging from 7-15 BUN and from 0.64, 0.62 to 0.69 of creatinine.     Malachi Pro. Ambrose Mantle, M.D.    TFH/MEDQ  D:  10/04/2013  T:  10/05/2013  Job:  [id number]

## 2014-04-11 IMAGING — US US PELVIS LIMITED
1 series · 14 of 15 positions shown · non-contrast
Comparison: CT 03/15/2012

CLINICAL DATA: Nodule right upper buttock

US PELVIS LIMITED OR FOLLOW UP

[Series 1: us pelvis limited · 0.06mm/px · 14 of 15 slices shown]
[im 1/15]
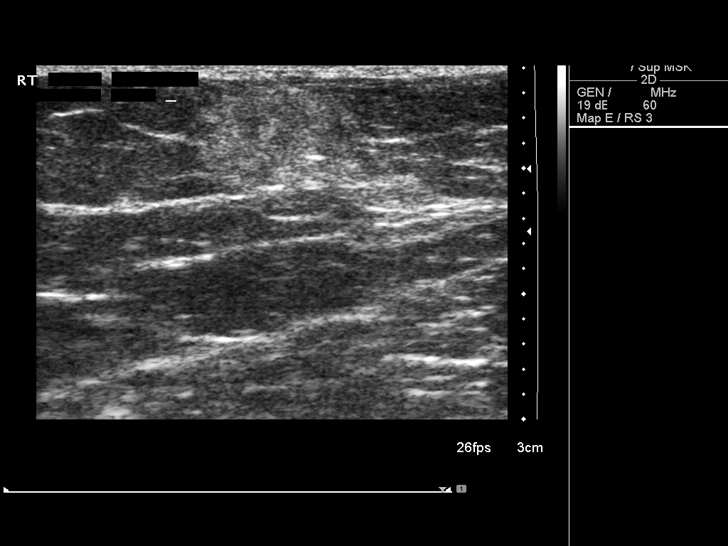
[im 2/15]
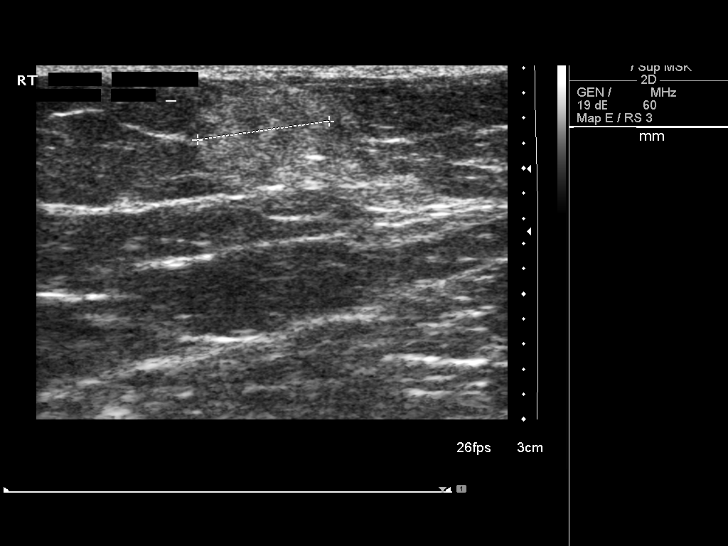
[im 3/15]
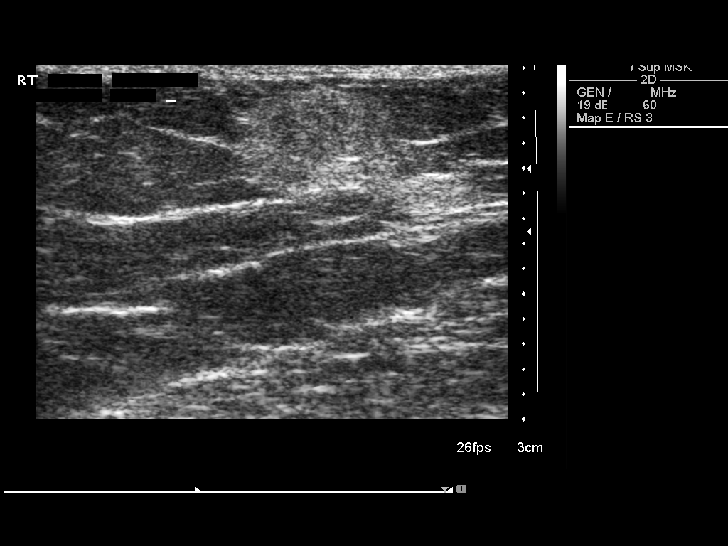
[im 4/15]
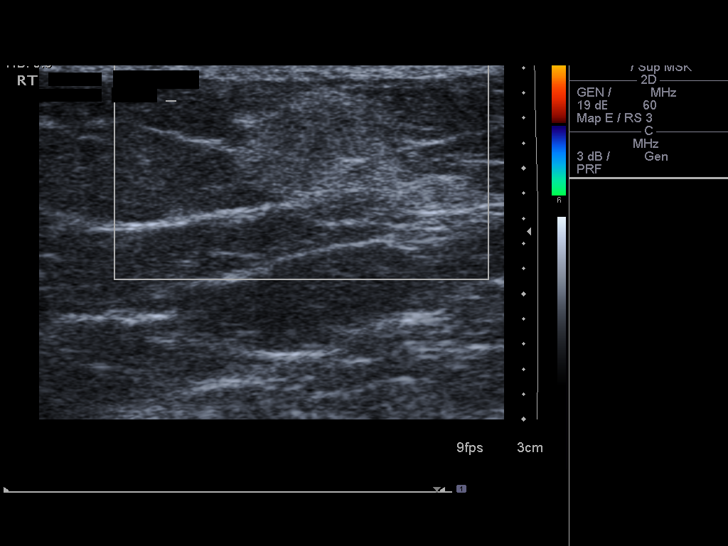
[im 5/15]
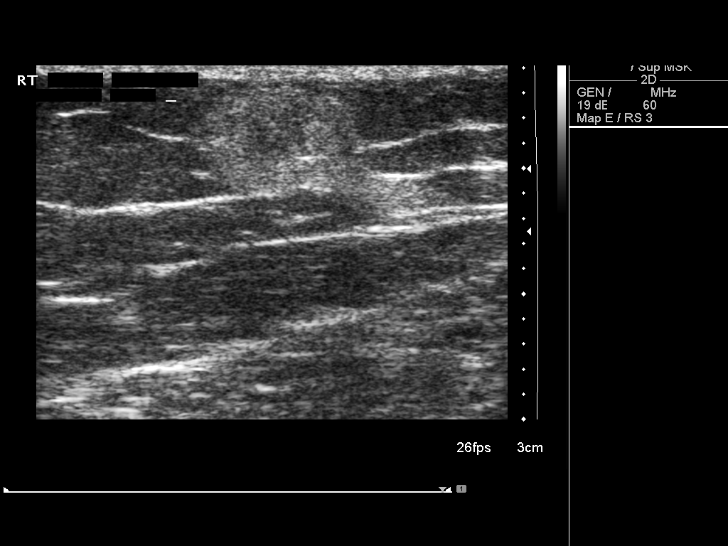
[im 6/15]
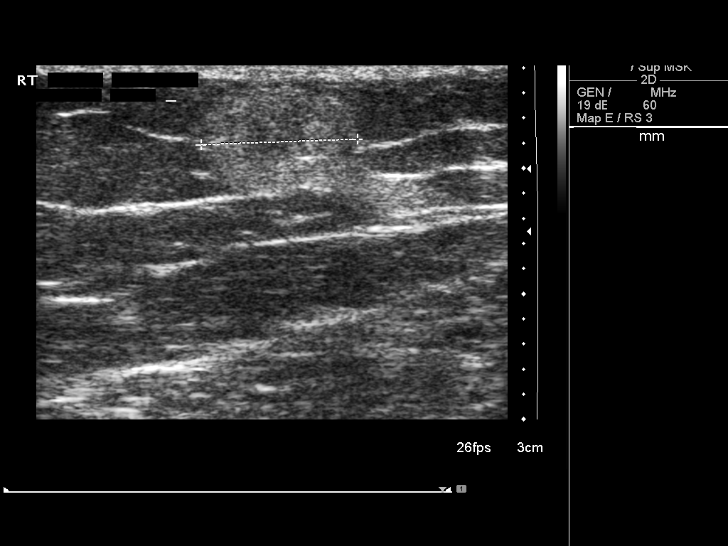
[im 7/15]
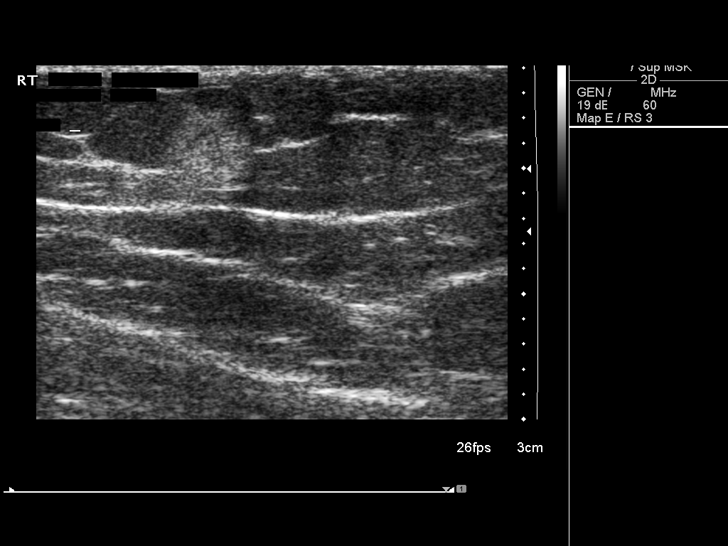
[im 9/15]
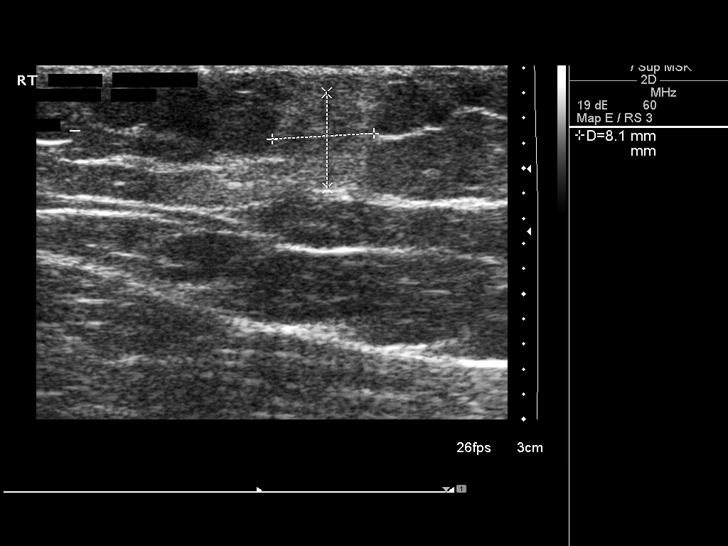
[im 10/15]
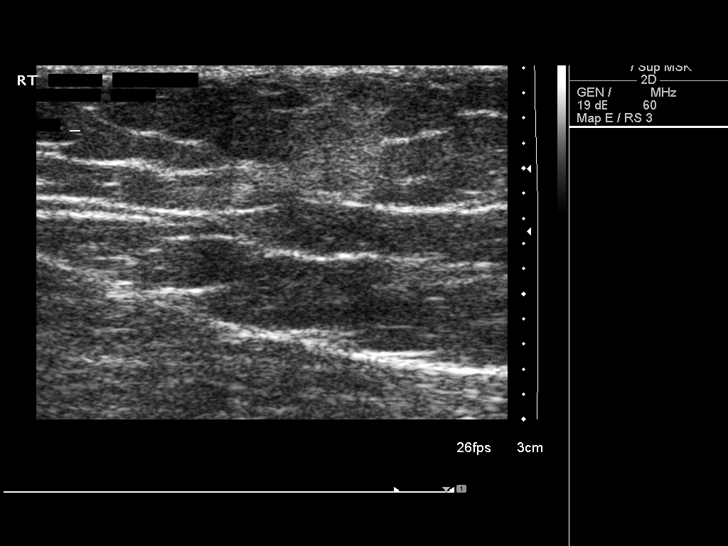
[im 11/15]
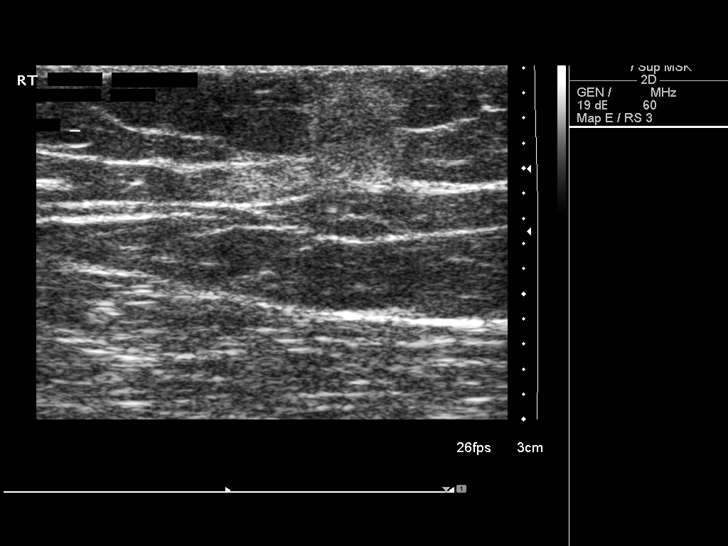
[im 12/15]
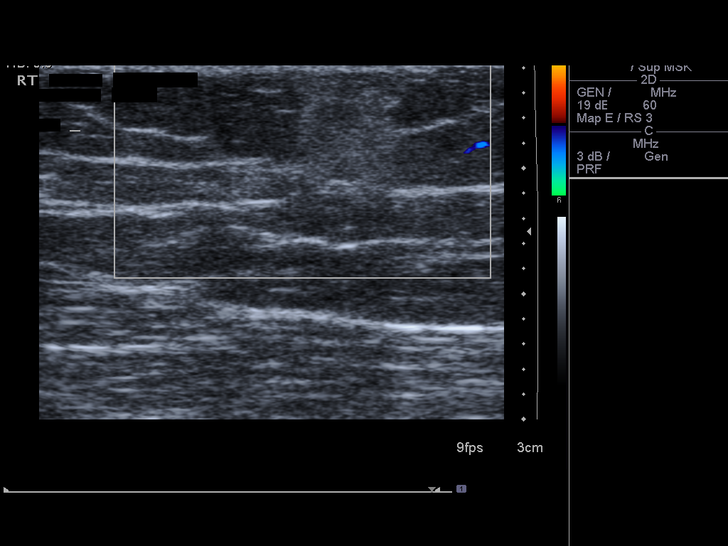
[im 13/15]
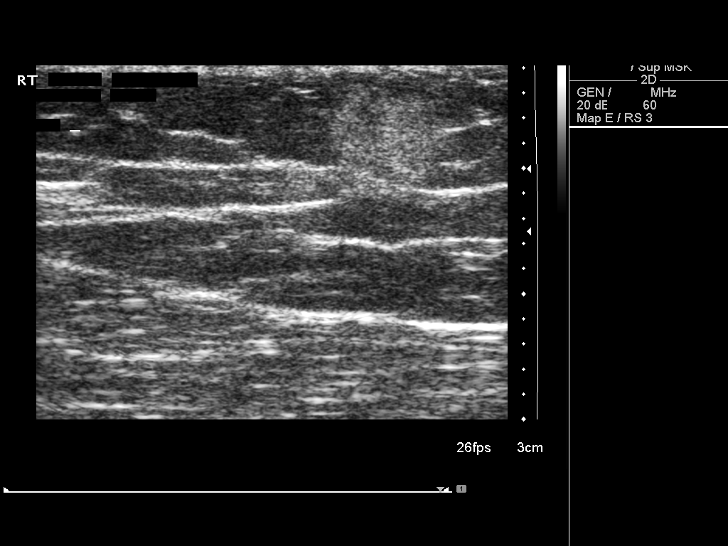
[im 14/15]
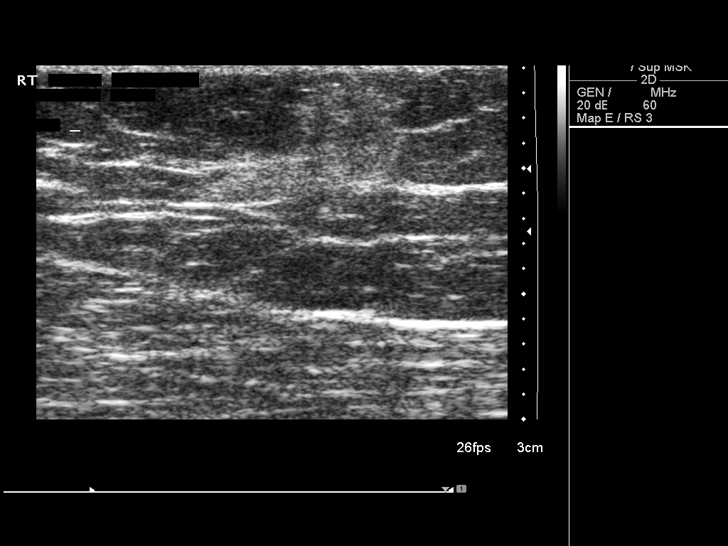
[im 15/15]
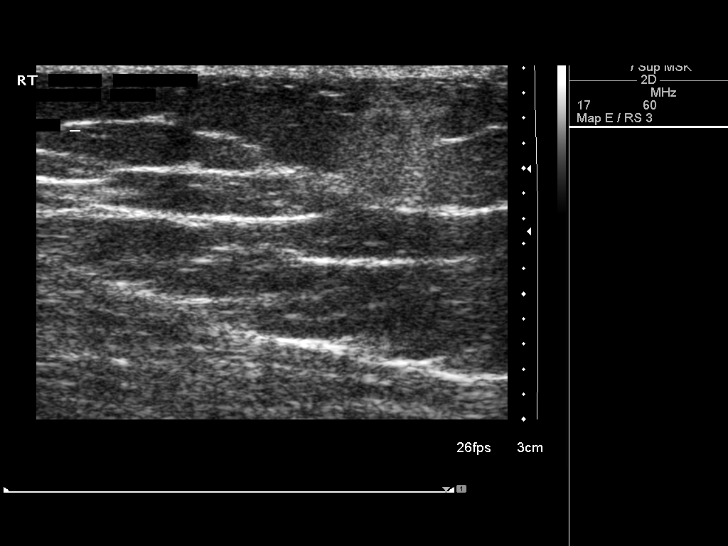

[14 of 15 positions shown; findings below may reference images not displayed]

FINDINGS: In the region of the palpable abnormality there is a
echogenic focus within the subcutaneous fat.  This measures 1.3 x
0.8 x 0.8 cm.  The margins are largely circumscribed but
demonstrate some mild irregularity and no obvious capsule.  No
evidence for marked irregularity to suggest an invasive nature is
seen and no flow is noted with color Doppler evaluation.  This
finding is nonspecific and may be related to an area of focal fat
necrosis, post traumatic or indicative of an area of focal
inflammation.  Has the patient had an injection in this location?
IMPRESSION: Nonspecific echogenic area in the subcutaneous tissue without
overtly worrisome features correlates with the palpable
abnormality.  Please see above report for discussion. Clinical
correlation is recommended. If this area continues to increase in
size or prominence with physical exam, tissue sampling with
sonographic guidance can be performed.

## 2014-08-08 ENCOUNTER — Encounter (HOSPITAL_COMMUNITY): Payer: Self-pay | Admitting: Obstetrics and Gynecology

## 2016-08-22 LAB — OB RESULTS CONSOLE RUBELLA ANTIBODY, IGM: Rubella: IMMUNE

## 2016-08-22 LAB — OB RESULTS CONSOLE GC/CHLAMYDIA
CHLAMYDIA, DNA PROBE: NEGATIVE
Gonorrhea: NEGATIVE

## 2016-08-22 LAB — OB RESULTS CONSOLE ABO/RH: RH TYPE: POSITIVE

## 2016-08-22 LAB — OB RESULTS CONSOLE HIV ANTIBODY (ROUTINE TESTING): HIV: NONREACTIVE

## 2016-08-22 LAB — OB RESULTS CONSOLE RPR: RPR: NONREACTIVE

## 2016-08-22 LAB — OB RESULTS CONSOLE HEPATITIS B SURFACE ANTIGEN: HEP B S AG: NEGATIVE

## 2016-08-22 LAB — OB RESULTS CONSOLE ANTIBODY SCREEN: ANTIBODY SCREEN: NEGATIVE

## 2016-10-07 NOTE — L&D Delivery Note (Signed)
Delivery Note Pt progressed to complete dilation and pushed great for about 45 minutes. THere were variable decelerations with pushing but resolved after contractions.   At 4:57 PM a healthy female was delivered via VBAC, Spontaneous (Presentation: OA  ).  APGAR: 7, 9; weight pending .   Placenta status: delivered spontaneously with trailing membranes removed .  Cord:  with the following complications:cord up beside chest .    Anesthesia: epidural  Episiotomy: None Lacerations:  Second degree Suture Repair: 3.0 vicryl rapide Est. Blood Loss (mL):  275mL  Mom to postpartum.  Baby to Couplet care / Skin to Skin.  Oliver PilaKathy W Cletus Paris 03/27/2017, 5:20 PM

## 2017-02-20 ENCOUNTER — Encounter (HOSPITAL_COMMUNITY): Payer: Self-pay | Admitting: *Deleted

## 2017-02-20 ENCOUNTER — Inpatient Hospital Stay (HOSPITAL_COMMUNITY)
Admission: AD | Admit: 2017-02-20 | Discharge: 2017-02-20 | Disposition: A | Discharge status: Home / Self Care | Payer: Managed Care, Other (non HMO) | Source: Ambulatory Visit | Attending: Obstetrics and Gynecology | Admitting: Obstetrics and Gynecology

## 2017-02-20 DIAGNOSIS — I10 Essential (primary) hypertension: Secondary | ICD-10-CM | POA: Insufficient documentation

## 2017-02-20 DIAGNOSIS — O163 Unspecified maternal hypertension, third trimester: Secondary | ICD-10-CM | POA: Diagnosis not present

## 2017-02-20 DIAGNOSIS — O10913 Unspecified pre-existing hypertension complicating pregnancy, third trimester: Secondary | ICD-10-CM | POA: Diagnosis not present

## 2017-02-20 DIAGNOSIS — Z3A35 35 weeks gestation of pregnancy: Secondary | ICD-10-CM | POA: Diagnosis not present

## 2017-02-20 HISTORY — DX: Unspecified pre-eclampsia, unspecified trimester: O14.90

## 2017-02-20 LAB — COMPREHENSIVE METABOLIC PANEL
ALT: 13 U/L — AB (ref 14–54)
ANION GAP: 10 (ref 5–15)
AST: 19 U/L (ref 15–41)
Albumin: 3.1 g/dL — ABNORMAL LOW (ref 3.5–5.0)
Alkaline Phosphatase: 169 U/L — ABNORMAL HIGH (ref 38–126)
BUN: 8 mg/dL (ref 6–20)
CO2: 22 mmol/L (ref 22–32)
Calcium: 9 mg/dL (ref 8.9–10.3)
Chloride: 105 mmol/L (ref 101–111)
Creatinine, Ser: 0.59 mg/dL (ref 0.44–1.00)
GFR calc non Af Amer: >60 mL/min (ref >60)
GLUCOSE: 98 mg/dL (ref 65–99)
Potassium: 3.7 mmol/L (ref 3.5–5.1)
Sodium: 137 mmol/L (ref 135–145)
TOTAL PROTEIN: 6.5 g/dL (ref 6.5–8.1)
Total Bilirubin: 0.4 mg/dL (ref 0.3–1.2)

## 2017-02-20 LAB — CBC
HCT: 27.3 % — ABNORMAL LOW (ref 36.0–46.0)
Hemoglobin: 9 g/dL — ABNORMAL LOW (ref 12.0–15.0)
MCH: 26.9 pg (ref 26.0–34.0)
MCHC: 33 g/dL (ref 30.0–36.0)
MCV: 81.5 fL (ref 78.0–100.0)
Platelets: 283 10*3/uL (ref 150–400)
RBC: 3.35 MIL/uL — ABNORMAL LOW (ref 3.87–5.11)
RDW: 14.3 % (ref 11.5–15.5)
WBC: 16.3 10*3/uL — ABNORMAL HIGH (ref 4.0–10.5)

## 2017-02-20 LAB — PROTEIN / CREATININE RATIO, URINE
Creatinine, Urine: 74 mg/dL
Protein Creatinine Ratio: 0.24 mg/mg{Cre} — ABNORMAL HIGH (ref 0.00–0.15)
Total Protein, Urine: 18 mg/dL

## 2017-02-20 LAB — URINALYSIS, ROUTINE W REFLEX MICROSCOPIC
Bilirubin Urine: NEGATIVE
Glucose, UA: NEGATIVE mg/dL
HGB URINE DIPSTICK: NEGATIVE
KETONES UR: NEGATIVE mg/dL
Leukocytes, UA: NEGATIVE
NITRITE: NEGATIVE
Protein, ur: NEGATIVE mg/dL
Specific Gravity, Urine: 1.011 (ref 1.005–1.030)
pH: 7 (ref 5.0–8.0)

## 2017-02-20 MED ORDER — HYDRALAZINE HCL 20 MG/ML IJ SOLN: 10.0000 mg | Freq: Once | INTRAMUSCULAR | Status: DC | PRN

## 2017-02-20 MED ORDER — LABETALOL HCL 100 MG PO TABS: 100.0000 mg | ORAL_TABLET | Freq: Two times a day (BID) | ORAL | 0 refills | Status: DC

## 2017-02-20 MED ORDER — LABETALOL HCL 5 MG/ML IV SOLN
20.0000 mg | INTRAVENOUS | Status: DC | PRN
  Administered 2017-02-20: 20 mg via INTRAVENOUS
  Filled 2017-02-20: qty 4

## 2017-02-20 NOTE — MAU Note (Signed)
Pt sent over for elevated b/ps. C/O headache Denies any visual changes. Some swelling and tighness in hands and feet. Good fetal movement felt.

## 2017-02-20 NOTE — Discharge Instructions (Signed)
24-Hour Urine Collection How do I do a 24-hour urine collection?  When you get up in the morning, urinate in the toilet and flush. Write down the time. This will be your start time on the day of collection and your end time on the next morning.  From then on, collect all of your urine in the plastic jug that is given to you.  Stop collecting your urine 24 hours after you started.  If the plastic jug that is given to you already has liquid in it, that is okay. Do not throw out the liquid or rinse out the jug. Some tests need the liquid to be added to your urine.  Keep your plastic jug cool in an ice chest or keep it in the refrigerator during the test.  When 24 hours are over, bring your plastic jug to the clinic lab. Keep the jug cool in an ice chest while you are bringing it to the lab. This information is not intended to replace advice given to you by your health care provider. Make sure you discuss any questions you have with your health care provider. Document Released: 12/20/2008 Document Revised: 05/27/2016 Document Reviewed: 02/16/2014 Elsevier Interactive Patient Education  2017 Else[employer] Inc[employer]      Hypertension During Pregnancy Hypertension, commonly called high blood pressure, is when the force of blood pumping through your arteries is too strong. Arteries are blood vessels that carry blood from the heart throughout the body. Hypertension during pregnancy can cause problems for you and your baby. Your baby may be born early (prematurely) or may not weigh as much as he or she should at birth. Very bad cases of hypertension during pregnancy can be life-threatening. Different types of hypertension can occur during pregnancy. These include:  Chronic hypertension. This happens when:  You have hypertension before pregnancy and it continues during pregnancy.  You develop hypertension before you are [redacted] weeks pregnant, and it continues during pregnancy.  Gestational hypertension. This  is hypertension that develops after the 20th week of pregnancy.  Preeclampsia, also called toxemia of pregnancy. This is a very serious type of hypertension that develops only during pregnancy. It affects the whole body, and it can be very dangerous for you and your baby. Gestational hypertension and preeclampsia usually go away within 6 weeks after your baby is born. Women who have hypertension during pregnancy have a greater chance of developing hypertension later in life or during future pregnancies. What are the causes? The exact cause of hypertension is not known. What increases the risk? There are certain factors that make it more likely for you to develop hypertension during pregnancy. These include:  Having hypertension during a previous pregnancy or prior to pregnancy.  Being overweight.  Being older than age 50.  Being pregnant for the first time or being pregnant with more than one baby.  Becoming pregnant using fertilization methods such as IVF (in vitro fertilization).  Having diabetes, kidney problems, or systemic lupus erythematosus.  Having a family history of hypertension. What are the signs or symptoms? Chronic hypertension and gestational hypertension rarely cause symptoms. Preeclampsia causes symptoms, which may include:  Increased protein in your urine. Your health care provider will check for this at every visit before you give birth (prenatal visit).  Severe headaches.  Sudden weight gain.  Swelling of the hands, face, legs, and feet.  Nausea and vomiting.  Vision problems, such as blurred or double vision.  Numbness in the face, arms, legs, and feet.  Dizziness.  Slurred speech.  Sensitivity to bright lights.  Abdominal pain.  Convulsions. How is this diagnosed? You may be diagnosed with hypertension during a routine prenatal exam. At each prenatal visit, you may:  Have a urine test to check for high amounts of protein in your urine.  Have  your blood pressure checked. A blood pressure reading is recorded as two numbers, such as "120 over 80" (or 120/80). The first ("top") number is called the systolic pressure. It is a measure of the pressure in your arteries when your heart beats. The second ("bottom") number is called the diastolic pressure. It is a measure of the pressure in your arteries as your heart relaxes between beats. Blood pressure is measured in a unit called mm Hg. A normal blood pressure reading is:  Systolic: below 120.  Diastolic: below 80. The type of hypertension that you are diagnosed with depends on your test results and when your symptoms developed.  Chronic hypertension is usually diagnosed before 20 weeks of pregnancy.  Gestational hypertension is usually diagnosed after 20 weeks of pregnancy.  Hypertension with high amounts of protein in the urine is diagnosed as preeclampsia.  Blood pressure measurements that stay above 160 systolic, or above 110 diastolic, are signs of severe preeclampsia. How is this treated? Treatment for hypertension during pregnancy varies depending on the type of hypertension you have and how serious it is.  If you take medicines called ACE inhibitors to treat chronic hypertension, you may need to switch medicines. ACE inhibitors should not be taken during pregnancy.  If you have gestational hypertension, you may need to take blood pressure medicine.  If you are at risk for preeclampsia, your health care provider may recommend that you take a low-dose aspirin every day to prevent high blood pressure during your pregnancy.  If you have severe preeclampsia, you may need to be hospitalized so you and your baby can be monitored closely. You may also need to take medicine (magnesium sulfate) to prevent seizures and to lower blood pressure. This medicine may be given as an injection or through an IV tube.  In some cases, if your condition gets worse, you may need to deliver your baby  early. Follow these instructions at home: Eating and drinking   Drink enough fluid to keep your urine clear or pale yellow.  Eat a healthy diet that is low in salt (sodium). Do not add salt to your food. Check food labels to see how much sodium a food or beverage contains. Lifestyle   Do not use any products that contain nicotine or tobacco, such as cigarettes and e-cigarettes. If you need help quitting, ask your health care provider.  Do not use alcohol.  Avoid caffeine.  Avoid stress as much as possible. Rest and get plenty of sleep. General instructions   Take over-the-counter and prescription medicines only as told by your health care provider.  While lying down, lie on your left side. This keeps pressure off your baby.  While sitting or lying down, raise (elevate) your feet. Try putting some pillows under your lower legs.  Exercise regularly. Ask your health care provider what kinds of exercise are best for you.  Keep all prenatal and follow-up visits as told by your health care provider. This is important. Contact a health care provider if:  You have symptoms that your health care provider told you may require more treatment or monitoring, such as:  Fever.  Vomiting.  Headache. Get help right away if:  You  have severe abdominal pain or vomiting that does not get better with treatment.  You suddenly develop swelling in your hands, ankles, or face.  You gain 4 lbs (1.8 kg) or more in 1 week.  You develop vaginal bleeding, or you have blood in your urine.  You do not feel your baby moving as much as usual.  You have blurred or double vision.  You have muscle twitching or sudden tightening (spasms).  You have shortness of breath.  Your lips or fingernails turn blue. This information is not intended to replace advice given to you by your health care provider. Make sure you discuss any questions you have with your health care provider. Document Released:  06/11/2011 Document Revised: 04/12/2016 Document Reviewed: 03/08/2016 Elsevier Interactive Patient Education  2017 [employer].

## 2017-02-20 NOTE — MAU Provider Note (Signed)
History     CSN: [id number]  Arrival date and time: 02/20/17 1633  First Provider Initiated Contact with Patient 02/20/17 1739      Chief Complaint  Patient presents with  . Hypertension   HPI  Sherry Cobb is a 31 y.o. G2P0101 at 60108w1d who presents with hypertension. Patient had history of severe pre eclampsia in previous pregnancy that resulted in preterm c/section.  Took BP at home today & reports it being 160s/90s. States she "just didn't feel right" today & had mild headache. Denies visual changes, chest pain, SOB, epigastric pain, or n/v. Positive fetal movement.   OB History    Gravida Para Term Preterm AB Living   2 1   1   1    SAB TAB Ectopic Multiple Live Births           1      Past Medical History:  Diagnosis Date  . Kidney stones     Past Surgical History:  Procedure Laterality Date  . CESAREAN SECTION N/A 10/01/2013   Procedure: Primary Cesarean Section Delivery Baby Girl @ [phone], Apgars 5/7;  Surgeon: Bing Plumehomas F Henley, MD;  Location: [facility];  Service: Obstetrics;  Laterality: N/A;  . NOSE SURGERY    . WISDOM TOOTH EXTRACTION      Family History  Problem Relation Age of Onset  . Heart disease Maternal Grandmother   . Heart disease Maternal Grandfather   . Heart disease Paternal Grandmother   . Heart disease Paternal Grandfather   . Hypertension Mother   . Hypertension Father   . Diabetes Father   . Cancer Father   . Kidney disease Father     Social History  Substance Use Topics  . Smoking status: Never Smoker  . Smokeless tobacco: Not on file  . Alcohol use Yes     Comment: not while pregnant    Allergies: No Known Allergies  Prescriptions Prior to Admission  Medication Sig Dispense Refill Last Dose  . aspirin EC 81 MG tablet Take 81 mg by mouth daily.   02/19/2017 at Unknown time  . diphenhydrAMINE (BENADRYL) 25 MG tablet Take 25 mg by mouth every 6 (six) hours as needed for allergies or sleep.    Past Week at Unknown time  .  diphenhydramine-acetaminophen (TYLENOL PM) 25-500 MG TABS tablet Take 1 tablet by mouth at bedtime as needed (sleep).   02/19/2017 at Unknown time  . loratadine (CLARITIN) 10 MG tablet Take 10 mg by mouth daily as needed for allergies.   02/19/2017 at Unknown time  . Prenatal Vit-Fe Fumarate-FA (PRENATAL MULTIVITAMIN) TABS tablet Take 1 tablet by mouth at bedtime.    02/19/2017 at Unknown time  . VENTOLIN HFA 108 (90 Base) MCG/ACT inhaler Inhale 1 puff into the lungs daily as needed for allergies.   prn    Review of Systems  Constitutional: Negative.   Eyes: Negative for visual disturbance.  Respiratory: Negative for shortness of breath.   Cardiovascular: Positive for leg swelling. Negative for chest pain.  Gastrointestinal: Negative.   Genitourinary: Negative.   Neurological: Positive for headaches. Negative for light-headedness.   Physical Exam   Blood pressure (!) 146/86, pulse (!) 101, temperature 98.6 F (37 C), temperature source Oral, resp. rate 18, height 5\' 4"  (1.626 m), weight 157 lb 1.9 oz (71.3 kg), unknown if currently breastfeeding.  Pulse Rate:  [95-109] 101 (05/17 1830) BP: (146-166)/(86-100) 146/86 (05/17 1830)   Physical Exam  Nursing note and vitals reviewed. Constitutional: She is  oriented to person, place, and time. She appears well-developed and well-nourished. No distress.  HENT:  Head: Normocephalic and atraumatic.  Eyes: Conjunctivae are normal. Right eye exhibits no discharge. Left eye exhibits no discharge. No scleral icterus.  Neck: Normal range of motion.  Cardiovascular: Normal rate, regular rhythm and normal heart sounds.   No murmur heard. Respiratory: Effort normal and breath sounds normal. No respiratory distress. She has no wheezes.  GI: Soft. Bowel sounds are normal. There is no tenderness.  Musculoskeletal: She exhibits no edema.  Neurological: She is alert and oriented to person, place, and time.  Reflex Scores:      Patellar reflexes are 3+ on  the right side and 3+ on the left side. No clonus  Skin: Skin is warm and dry. She is not diaphoretic.  Psychiatric: She has a normal mood and affect. Her behavior is normal. Judgment and thought content normal.   Fetal Tracing:  Baseline: 135 Variability: moderate Accelerations: 15x15 Decelerations: none  Toco: irr ctx MAU Course  Procedures Results for orders placed or performed during the hospital encounter of 02/20/17 (from the past 24 hour(s))  CBC     Status: Abnormal   Collection Time: 02/20/17  5:30 PM  Result Value Ref Range   WBC 16.3 (H) 4.0 - 10.5 K/uL   RBC 3.35 (L) 3.87 - 5.11 MIL/uL   Hemoglobin 9.0 (L) 12.0 - 15.0 g/dL   HCT 16.1 (L) 09.6 - 04.5 %   MCV 81.5 78.0 - 100.0 fL   MCH 26.9 26.0 - 34.0 pg   MCHC 33.0 30.0 - 36.0 g/dL   RDW 40.9 81.1 - 91.4 %   Platelets 283 150 - 400 K/uL  Comprehensive metabolic panel     Status: Abnormal   Collection Time: 02/20/17  5:30 PM  Result Value Ref Range   Sodium 137 135 - 145 mmol/L   Potassium 3.7 3.5 - 5.1 mmol/L   Chloride 105 101 - 111 mmol/L   CO2 22 22 - 32 mmol/L   Glucose, Bld 98 65 - 99 mg/dL   BUN 8 6 - 20 mg/dL   Creatinine, Ser 7.82 0.44 - 1.00 mg/dL   Calcium 9.0 8.9 - 95.6 mg/dL   Total Protein 6.5 6.5 - 8.1 g/dL   Albumin 3.1 (L) 3.5 - 5.0 g/dL   AST 19 15 - 41 U/L   ALT 13 (L) 14 - 54 U/L   Alkaline Phosphatase 169 (H) 38 - 126 U/L   Total Bilirubin 0.4 0.3 - 1.2 mg/dL   GFR calc non Af Amer >60 >60 mL/min   GFR calc Af Amer >60 >60 mL/min   Anion gap 10 5 - 15    MDM Reactive NST Severe range BPs  That responded to IV labetalol x 1 dose (20 mg). Patient reports resolution of headache after labetalol.  No further severe range BPs & PIH labs WNL Per review of prenatal record, pt has had BPs in this range since prior to [redacted] weeks gestation.  S/w Dr. Ellyn Hack regarding BPs & labs. Will discharge home with strict return precautions. Rx labetalol 100 mg BID. Patient to turn in 24 hour urine at next  appointment.   Assessment and Plan  A: 1. Maternal chronic hypertension in third trimester    P: Discharge home Rx labetalol 100 mg BID 24 hr urine jug, supplies, & instructions given Take BP at home -- given parameters to return or call office  Discussed reasons to return to MAU including  s/s preeclampsia Keep f/u in office  Judeth Horn 02/20/2017, 5:38 PM

## 2017-02-27 LAB — OB RESULTS CONSOLE GBS: STREP GROUP B AG: NEGATIVE

## 2017-03-22 ENCOUNTER — Other Ambulatory Visit: Payer: Self-pay | Admitting: Student

## 2017-03-25 ENCOUNTER — Telehealth (HOSPITAL_COMMUNITY): Payer: Self-pay | Admitting: *Deleted

## 2017-03-25 ENCOUNTER — Encounter (HOSPITAL_COMMUNITY): Payer: Self-pay | Admitting: *Deleted

## 2017-03-25 NOTE — Telephone Encounter (Signed)
Preadmission screen  

## 2017-03-26 ENCOUNTER — Telehealth (HOSPITAL_COMMUNITY): Payer: Self-pay | Admitting: *Deleted

## 2017-03-26 ENCOUNTER — Encounter (HOSPITAL_COMMUNITY): Payer: Self-pay | Admitting: *Deleted

## 2017-03-26 NOTE — Telephone Encounter (Signed)
Preadmission screen  

## 2017-03-26 NOTE — H&P (Signed)
Sherry Cobb is a 31 y.o. female G2P0101 at 3140 0/7 weeks (EDD 03/27/17 by LMP c/w 9 week US)  presenting for IOL at term with mild gestational hypertension managed with labetalol 100mg  po BID.  Prenatal care significant for a h/o severe preeclampsia/HELLP with prior LTCS at 33 weeks with her last pregnancy.  Her baseline BP is 130-140/80 but at 35 weeks she had some BP increased to 140-150/90's and was started on labetolol BID.  Her BP responded very well to this and have been no higher than 140/80's.  PIH labs have been done weekly and WNL as well as a repeat 24 hour urine which was 200mg .   She really wants to attempt a VBAC so we have been delaying IOL since BP very stable, but it has gradually started increasing again so we need to move forward with delivery.   OB History    Gravida Para Term Preterm AB Living   2 1   1   1    SAB TAB Ectopic Multiple Live Births           1    2014  LTCS 33 5/7 weeks  Past Medical History:  Diagnosis Date  . Kidney stones   . Preeclampsia 2014  . Pregnancy induced hypertension   . Preterm labor    Past Surgical History:  Procedure Laterality Date  . CESAREAN SECTION N/A 10/01/2013   Procedure: Primary Cesarean Section Delivery Baby Girl @ [phone], Apgars 5/7;  Surgeon: Bing Plumehomas F Henley, MD;  Location: [facility];  Service: Obstetrics;  Laterality: N/A;  . NOSE SURGERY    . WISDOM TOOTH EXTRACTION     Family History: family history includes Cancer in her father; Diabetes in her father; Heart disease in her maternal grandfather, maternal grandmother, paternal grandfather, and paternal grandmother; Hypertension in her father and mother; Kidney disease in her father. Social History:  reports that she has never smoked. She has never used smokeless tobacco. She reports that she drinks alcohol. She reports that she does not use drugs.     Maternal Diabetes: No Genetic Screening: Normal Maternal Ultrasounds/Referrals: Normal Fetal Ultrasounds or other  Referrals:  None Maternal Substance Abuse:  No Significant Maternal Medications:  Meds include: Other:   Labetalol Significant Maternal Lab Results:  None Other Comments:  None  Review of Systems  Gastrointestinal: Negative for abdominal pain.  Neurological: Negative for headaches.   Maternal Medical History:  Contractions: Perceived severity is mild.    Fetal activity: Perceived fetal activity is normal.    Prenatal complications: PIH.   Prenatal Complications - Diabetes: none.      unknown if currently breastfeeding. Maternal Exam:  Uterine Assessment: Contraction strength is mild.  Contraction frequency is irregular.   Abdomen: Patient reports no abdominal tenderness. Surgical scars: low transverse.   Fetal presentation: vertex  Introitus: Normal vulva. Normal vagina.    Physical Exam  Constitutional: She appears well-developed and well-nourished.  Cardiovascular: Normal rate and regular rhythm.   Respiratory: Effort normal.  GI: Soft.  Neurological: She is alert.  Psychiatric: She has a normal mood and affect.    Prenatal labs: ABO, Rh: A/Positive/-- (11/16 0000) Antibody: Negative (11/16 0000) Rubella: Immune (11/16 0000) RPR: Nonreactive (11/16 0000)  HBsAg: Negative (11/16 0000)  HIV: Non-reactive (11/16 0000)  GBS: Negative (05/24 0000)  One hour GCT 104 First trimester screen negative Essential panel negative  Assessment/Plan: D/w pt will attempt IOL with pitocin and see if can get her to progress.  If not, may need a repeat c-section.  Will recheck labs and monitor BP on admission.     Oliver Pila 03/26/2017, 8:29 PM

## 2017-03-27 ENCOUNTER — Inpatient Hospital Stay (HOSPITAL_COMMUNITY): Payer: Managed Care, Other (non HMO) | Admitting: Anesthesiology

## 2017-03-27 ENCOUNTER — Encounter (HOSPITAL_COMMUNITY): Payer: Self-pay

## 2017-03-27 ENCOUNTER — Inpatient Hospital Stay (HOSPITAL_COMMUNITY)
Admission: RE | Admit: 2017-03-27 | Discharge: 2017-03-29 | DRG: 775 | Disposition: A | Discharge status: Home / Self Care | Payer: Managed Care, Other (non HMO) | Source: Ambulatory Visit | Attending: Obstetrics and Gynecology | Admitting: Obstetrics and Gynecology

## 2017-03-27 DIAGNOSIS — O134 Gestational [pregnancy-induced] hypertension without significant proteinuria, complicating childbirth: Secondary | ICD-10-CM | POA: Diagnosis present

## 2017-03-27 DIAGNOSIS — O34211 Maternal care for low transverse scar from previous cesarean delivery: Secondary | ICD-10-CM | POA: Diagnosis present

## 2017-03-27 DIAGNOSIS — O34219 Maternal care for unspecified type scar from previous cesarean delivery: Secondary | ICD-10-CM | POA: Diagnosis present

## 2017-03-27 DIAGNOSIS — Z3A40 40 weeks gestation of pregnancy: Secondary | ICD-10-CM

## 2017-03-27 DIAGNOSIS — O133 Gestational [pregnancy-induced] hypertension without significant proteinuria, third trimester: Secondary | ICD-10-CM | POA: Diagnosis present

## 2017-03-27 LAB — COMPREHENSIVE METABOLIC PANEL
ALK PHOS: 208 U/L — AB (ref 38–126)
ALT: 14 U/L (ref 14–54)
AST: 19 U/L (ref 15–41)
Albumin: 3.3 g/dL — ABNORMAL LOW (ref 3.5–5.0)
Anion gap: 10 (ref 5–15)
BILIRUBIN TOTAL: 0.2 mg/dL — AB (ref 0.3–1.2)
BUN: 10 mg/dL (ref 6–20)
CALCIUM: 8.6 mg/dL — AB (ref 8.9–10.3)
CO2: 21 mmol/L — ABNORMAL LOW (ref 22–32)
CREATININE: 0.6 mg/dL (ref 0.44–1.00)
Chloride: 105 mmol/L (ref 101–111)
GFR calc Af Amer: >60 mL/min (ref >60)
Glucose, Bld: 99 mg/dL (ref 65–99)
POTASSIUM: 3.9 mmol/L (ref 3.5–5.1)
Sodium: 136 mmol/L (ref 135–145)
TOTAL PROTEIN: 7 g/dL (ref 6.5–8.1)

## 2017-03-27 LAB — CBC
HCT: 34.6 % — ABNORMAL LOW (ref 36.0–46.0)
HEMATOCRIT: 35.8 % — AB (ref 36.0–46.0)
HEMOGLOBIN: 11.1 g/dL — AB (ref 12.0–15.0)
Hemoglobin: 11.6 g/dL — ABNORMAL LOW (ref 12.0–15.0)
MCH: 27.6 pg (ref 26.0–34.0)
MCH: 27.4 pg (ref 26.0–34.0)
MCHC: 32.4 g/dL (ref 30.0–36.0)
MCHC: 32.1 g/dL (ref 30.0–36.0)
MCV: 85.2 fL (ref 78.0–100.0)
MCV: 85.4 fL (ref 78.0–100.0)
Platelets: 126 10*3/uL — ABNORMAL LOW (ref 150–400)
Platelets: 204 10*3/uL (ref 150–400)
RBC: 4.2 MIL/uL (ref 3.87–5.11)
RBC: 4.05 MIL/uL (ref 3.87–5.11)
RDW: 19.7 % — AB (ref 11.5–15.5)
RDW: 19.4 % — ABNORMAL HIGH (ref 11.5–15.5)
WBC: 15.4 10*3/uL — ABNORMAL HIGH (ref 4.0–10.5)
WBC: 20.8 10*3/uL — ABNORMAL HIGH (ref 4.0–10.5)

## 2017-03-27 LAB — TYPE AND SCREEN
ABO/RH(D): A POS
ANTIBODY SCREEN: NEGATIVE

## 2017-03-27 LAB — ABO/RH: ABO/RH(D): A POS

## 2017-03-27 LAB — PROTEIN / CREATININE RATIO, URINE
CREATININE, URINE: 29 mg/dL
PROTEIN CREATININE RATIO: 0.48 mg/mg{creat} — AB (ref 0.00–0.15)
TOTAL PROTEIN, URINE: 14 mg/dL

## 2017-03-27 LAB — RPR: RPR: NONREACTIVE

## 2017-03-27 MED ORDER — ACETAMINOPHEN 325 MG PO TABS
650.0000 mg | ORAL_TABLET | ORAL | Status: DC | PRN
  Administered 2017-03-28 (×2): 650 mg via ORAL
  Filled 2017-03-27 (×2): qty 2

## 2017-03-27 MED ORDER — PHENYLEPHRINE 40 MCG/ML (10ML) SYRINGE FOR IV PUSH (FOR BLOOD PRESSURE SUPPORT)
PREFILLED_SYRINGE | INTRAVENOUS | Status: AC
  Filled 2017-03-27: qty 20

## 2017-03-27 MED ORDER — LIDOCAINE HCL (PF) 1 % IJ SOLN
30.0000 mL | INTRAMUSCULAR | Status: DC | PRN
  Filled 2017-03-27: qty 30

## 2017-03-27 MED ORDER — LACTATED RINGERS IV SOLN: 500.0000 mL | INTRAVENOUS | Status: DC | PRN

## 2017-03-27 MED ORDER — OXYTOCIN 40 UNITS IN LACTATED RINGERS INFUSION - SIMPLE MED: 2.5000 [IU]/h | INTRAVENOUS | Status: DC

## 2017-03-27 MED ORDER — ONDANSETRON HCL 4 MG/2ML IJ SOLN: 4.0000 mg | Freq: Four times a day (QID) | INTRAMUSCULAR | Status: DC | PRN

## 2017-03-27 MED ORDER — WITCH HAZEL-GLYCERIN EX PADS
1.0000 "application " | MEDICATED_PAD | CUTANEOUS | Status: DC | PRN
  Administered 2017-03-28: 1 via TOPICAL

## 2017-03-27 MED ORDER — OXYTOCIN BOLUS FROM INFUSION
500.0000 mL | Freq: Once | INTRAVENOUS | Status: AC
  Administered 2017-03-27: 500 mL via INTRAVENOUS

## 2017-03-27 MED ORDER — FENTANYL 2.5 MCG/ML BUPIVACAINE 1/10 % EPIDURAL INFUSION (WH - ANES)
INTRAMUSCULAR | Status: AC
  Filled 2017-03-27: qty 100

## 2017-03-27 MED ORDER — ONDANSETRON HCL 4 MG PO TABS: 4.0000 mg | ORAL_TABLET | ORAL | Status: DC | PRN

## 2017-03-27 MED ORDER — EPHEDRINE 5 MG/ML INJ
10.0000 mg | INTRAVENOUS | Status: DC | PRN
  Filled 2017-03-27: qty 2

## 2017-03-27 MED ORDER — SIMETHICONE 80 MG PO CHEW: 80.0000 mg | CHEWABLE_TABLET | ORAL | Status: DC | PRN

## 2017-03-27 MED ORDER — LACTATED RINGERS IV SOLN
INTRAVENOUS | Status: DC
  Administered 2017-03-27 (×2): via INTRAVENOUS

## 2017-03-27 MED ORDER — ALBUTEROL SULFATE (2.5 MG/3ML) 0.083% IN NEBU: 3.0000 mL | INHALATION_SOLUTION | Freq: Four times a day (QID) | RESPIRATORY_TRACT | Status: DC

## 2017-03-27 MED ORDER — OXYCODONE-ACETAMINOPHEN 5-325 MG PO TABS: 1.0000 | ORAL_TABLET | ORAL | Status: DC | PRN

## 2017-03-27 MED ORDER — PHENYLEPHRINE 40 MCG/ML (10ML) SYRINGE FOR IV PUSH (FOR BLOOD PRESSURE SUPPORT)
80.0000 ug | PREFILLED_SYRINGE | INTRAVENOUS | Status: DC | PRN
  Filled 2017-03-27: qty 5

## 2017-03-27 MED ORDER — PRENATAL MULTIVITAMIN CH
1.0000 | ORAL_TABLET | Freq: Every day | ORAL | Status: DC
  Administered 2017-03-28 – 2017-03-29 (×2): 1 via ORAL
  Filled 2017-03-27 (×2): qty 1

## 2017-03-27 MED ORDER — ONDANSETRON HCL 4 MG/2ML IJ SOLN: 4.0000 mg | INTRAMUSCULAR | Status: DC | PRN

## 2017-03-27 MED ORDER — LABETALOL HCL 100 MG PO TABS
100.0000 mg | ORAL_TABLET | Freq: Two times a day (BID) | ORAL | Status: DC
  Administered 2017-03-27 – 2017-03-29 (×4): 100 mg via ORAL
  Filled 2017-03-27 (×4): qty 1

## 2017-03-27 MED ORDER — TERBUTALINE SULFATE 1 MG/ML IJ SOLN
0.2500 mg | Freq: Once | INTRAMUSCULAR | Status: DC | PRN
  Filled 2017-03-27: qty 1

## 2017-03-27 MED ORDER — SENNOSIDES-DOCUSATE SODIUM 8.6-50 MG PO TABS
2.0000 | ORAL_TABLET | ORAL | Status: DC
  Administered 2017-03-28 (×2): 2 via ORAL
  Filled 2017-03-27 (×2): qty 2

## 2017-03-27 MED ORDER — ZOLPIDEM TARTRATE 5 MG PO TABS: 5.0000 mg | ORAL_TABLET | Freq: Every evening | ORAL | Status: DC | PRN

## 2017-03-27 MED ORDER — LACTATED RINGERS IV SOLN
500.0000 mL | Freq: Once | INTRAVENOUS | Status: AC
  Administered 2017-03-27: 500 mL via INTRAVENOUS

## 2017-03-27 MED ORDER — DIPHENHYDRAMINE HCL 50 MG/ML IJ SOLN: 12.5000 mg | INTRAMUSCULAR | Status: DC | PRN

## 2017-03-27 MED ORDER — DIPHENHYDRAMINE HCL 25 MG PO CAPS: 25.0000 mg | ORAL_CAPSULE | Freq: Four times a day (QID) | ORAL | Status: DC | PRN

## 2017-03-27 MED ORDER — OXYTOCIN 40 UNITS IN LACTATED RINGERS INFUSION - SIMPLE MED
1.0000 m[IU]/min | INTRAVENOUS | Status: DC
  Administered 2017-03-27: 2 m[IU]/min via INTRAVENOUS
  Filled 2017-03-27: qty 1000

## 2017-03-27 MED ORDER — OXYCODONE-ACETAMINOPHEN 5-325 MG PO TABS: 2.0000 | ORAL_TABLET | ORAL | Status: DC | PRN

## 2017-03-27 MED ORDER — LACTATED RINGERS IV SOLN
INTRAVENOUS | Status: DC
  Administered 2017-03-27: 14:00:00 via INTRAUTERINE

## 2017-03-27 MED ORDER — VITAMIN K1 1 MG/0.5ML IJ SOLN
INTRAMUSCULAR | Status: AC
  Filled 2017-03-27: qty 0.5

## 2017-03-27 MED ORDER — LABETALOL HCL 5 MG/ML IV SOLN: 20.0000 mg | INTRAVENOUS | Status: DC | PRN

## 2017-03-27 MED ORDER — SOD CITRATE-CITRIC ACID 500-334 MG/5ML PO SOLN: 30.0000 mL | ORAL | Status: DC | PRN

## 2017-03-27 MED ORDER — BENZOCAINE-MENTHOL 20-0.5 % EX AERO
1.0000 "application " | INHALATION_SPRAY | CUTANEOUS | Status: DC | PRN
  Administered 2017-03-27: 1 via TOPICAL
  Filled 2017-03-27: qty 56

## 2017-03-27 MED ORDER — DIBUCAINE 1 % RE OINT
1.0000 "application " | TOPICAL_OINTMENT | RECTAL | Status: DC | PRN
  Administered 2017-03-28: 1 via RECTAL
  Filled 2017-03-27: qty 28

## 2017-03-27 MED ORDER — COCONUT OIL OIL: 1.0000 "application " | TOPICAL_OIL | Status: DC | PRN

## 2017-03-27 MED ORDER — TETANUS-DIPHTH-ACELL PERTUSSIS 5-2.5-18.5 LF-MCG/0.5 IM SUSP: 0.5000 mL | Freq: Once | INTRAMUSCULAR | Status: DC

## 2017-03-27 MED ORDER — FENTANYL 2.5 MCG/ML BUPIVACAINE 1/10 % EPIDURAL INFUSION (WH - ANES)
14.0000 mL/h | INTRAMUSCULAR | Status: DC | PRN
  Administered 2017-03-27 (×2): 14 mL/h via EPIDURAL

## 2017-03-27 MED ORDER — LIDOCAINE HCL (PF) 1 % IJ SOLN
INTRAMUSCULAR | Status: DC | PRN
  Administered 2017-03-27 (×2): 5 mL via EPIDURAL

## 2017-03-27 MED ORDER — ACETAMINOPHEN 325 MG PO TABS: 650.0000 mg | ORAL_TABLET | ORAL | Status: DC | PRN

## 2017-03-27 MED ORDER — IBUPROFEN 600 MG PO TABS
600.0000 mg | ORAL_TABLET | Freq: Four times a day (QID) | ORAL | Status: DC
  Administered 2017-03-27 – 2017-03-29 (×7): 600 mg via ORAL
  Filled 2017-03-27 (×8): qty 1

## 2017-03-27 MED ORDER — HYDRALAZINE HCL 20 MG/ML IJ SOLN: 10.0000 mg | Freq: Once | INTRAMUSCULAR | Status: DC | PRN

## 2017-03-27 NOTE — Progress Notes (Signed)
Dr Senaida Oresichardson notified that pt's blood pressure elevated 158 /90.  Patient has been of labetalol bid for several weeks and asked if the 2200 dose could be given earlier. Dr Senaida Oresichardson wants dose given now.  Pt has not taken any labetalol today

## 2017-03-27 NOTE — Progress Notes (Signed)
Patient ID: Sherry Cobb, female   DOB: April 04, 1986, 31 y.o.   MRN: [id number] Late entry note  Saw pt at 200pm for increased variable decelerations and FHR baseline was 120 with repetitive decels with contractions.   Interventions done with position changes, stopping pitocin and amnio infusion and decelerations improved.  Pt placed in exaggerated Sims for probable OP lie.  Cervix c/6-7/0  Will follow progress and restart pitocin as needed.

## 2017-03-27 NOTE — Anesthesia Procedure Notes (Signed)
Epidural Patient location during procedure: OB Start time: 03/27/2017 11:41 AM End time: 03/27/2017 11:45 AM  Staffing Anesthesiologist: Leilani AbleHATCHETT, Laurencia Roma Performed: anesthesiologist   Preanesthetic Checklist Completed: patient identified, surgical consent, pre-op evaluation, timeout performed, IV checked, risks and benefits discussed and monitors and equipment checked  Epidural Patient position: sitting Prep: site prepped and draped and DuraPrep Patient monitoring: continuous pulse ox and blood pressure Approach: midline Location: L3-L4 Injection technique: LOR air  Needle:  Needle type: Tuohy  Needle gauge: 17 G Needle length: 9 cm and 9 Needle insertion depth: 5 cm cm Catheter type: closed end flexible Catheter size: 19 Gauge Catheter at skin depth: 10 cm Test dose: negative and Other  Assessment Sensory level: T9 Events: blood not aspirated, injection not painful, no injection resistance, negative IV test and no paresthesia  Additional Notes Reason for block:procedure for pain

## 2017-03-27 NOTE — Progress Notes (Signed)
Patient ID: Sherry ManlyLorie S Strzelecki, female   DOB: 1986/02/13, 31 y.o.   MRN: [id number] Pt received epidural and is comfortable  afeb BP 140-87-165/91 FHR category 1  80/2/-1 AROM scant clear  IUPC and FSE placed Will follow progress.

## 2017-03-27 NOTE — Lactation Note (Signed)
This note was copied from a baby's chart. Lactation Consultation Note  Patient Name: Girl Catalina LungerLorie Humphreys BJYNW'GToday's Date: 03/27/2017 Reason for consult: Initial assessment   Initial assessment with mom of < 1 hour old infant.mom reports her last daughter was born at 6333 weeks and did not BF, mom pumped for 4 months.  Infant awake and alert and cueing to feed. Assisted mom in latching infant to right breast in the cross cradle hold. Infant latched easily with flanged lips, rhythmic suckles and intermittent swallows. Infant with large nipples, right nipple> left nipple. Mom was able to hand express glistening of colostrum. Infant was still feeding when LC left room.   Enc mom to keep infant STS as much as possible. Enc mom to feed infant STS 8-12 x in 24 hours at first feeding cues. Enc mom to hand express prior to latch. Enc mom to massage/compress breast with feedings. Enc mom to allow infant to feed as long as she wants and to offer both breasts with each feeding.   BF Resources Handout and [facility] broch[facility] given, mom informed of IP/OP services, BF Support Groups and LC phone #. Enc mom to call out for feeding assistance as needed.  Mom has a Medela PIS and Motif pump at home.    Maternal Data Formula Feeding for Exclusion: No Has patient been taught Hand Expression?: Yes Does the patient have breastfeeding experience prior to this delivery?: Yes  Feeding Feeding Type: Breast Fed Length of feed: 10 min (still feeding when LC left room)  LATCH Score/Interventions Latch: Grasps breast easily, tongue down, lips flanged, rhythmical sucking.  Audible Swallowing: A few with stimulation Intervention(s): Skin to skin;Hand expression  Type of Nipple: Everted at rest and after stimulation (very large nipples)  Comfort (Breast/Nipple): Soft / non-tender     Hold (Positioning): Assistance needed to correctly position infant at breast and maintain latch. Intervention(s): Breastfeeding basics  reviewed;Support Pillows;Position options;Skin to skin  LATCH Score: 8  Lactation Tools Discussed/Used WIC Program: No   Consult Status Consult Status: Follow-up Date: 03/28/17 Follow-up type: In-patient    Silas FloodSharon S Hice 03/27/2017, 5:53 PM

## 2017-03-27 NOTE — Progress Notes (Signed)
Foley catheter already in place removed at 1645 - output of 

## 2017-03-27 NOTE — Anesthesia Pain Management Evaluation Note (Signed)
  CRNA Pain Management Visit Note  Patient: [name], 31 y.o., female  "Hello I am a member of the anesthesia team at [facility]. We have an anesthesia team available at all times to provide care throughout the hospital, including epidural management and anesthesia for C-section. I don't know your plan for the delivery whether it a natural birth, water birth, IV sedation, nitrous supplementation, doula or epidural, but we want to meet your pain goals."   1.Was your pain managed to your expectations on prior hospitalizations?   No prior hospitalizations  2.What is your expectation for pain management during this hospitalization?     Epidural  3.How can we help you reach that goal? Epidural when ready  Record the patient's initial score and the patient's pain goal.   Pain: 1  Pain Goal: 6 [facility] wants you to be able to say your pain was always managed very well.  Edison PaceWILKERSON,Namiah Dunnavant 03/27/2017

## 2017-03-27 NOTE — Progress Notes (Signed)
Patient ID: [name], female   DOB: 11-11-1985, 31 y.o.   MRN: [id number] Pt here and just started on pitocin BP 140/90's but has not taken labetalol today  FHR category 1  Cervix 70/1+/-2/post  Attempted AROM but no obvious fluid return, will try again if does not begin leaking Labs ok except platelets 126K which is a big difference from last platelet count 2-3 weeks ago at 220+, will let anesthesia know and follow.

## 2017-03-27 NOTE — Anesthesia Preprocedure Evaluation (Signed)
Anesthesia Evaluation  Patient identified by MRN, date of birth, ID band Patient awake    Reviewed: Allergy & Precautions, H&P , NPO status , Patient's Chart, lab work & pertinent test results  Airway Mallampati: I  TM Distance: >3 FB Neck ROM: full    Dental no notable dental hx.    Pulmonary neg pulmonary ROS,    Pulmonary exam normal breath sounds clear to auscultation       Cardiovascular hypertension, Pt. on medications Normal cardiovascular exam     Neuro/Psych negative neurological ROS  negative psych ROS   GI/Hepatic negative GI ROS, Neg liver ROS,   Endo/Other  negative endocrine ROS  Renal/GU      Musculoskeletal   Abdominal Normal abdominal exam  (+)   Peds  Hematology negative hematology ROS (+)   Anesthesia Other Findings   Reproductive/Obstetrics (+) Pregnancy                             Anesthesia Physical Anesthesia Plan  ASA: II  Anesthesia Plan: Epidural   Post-op Pain Management:    Induction:   PONV Risk Score and Plan:   Airway Management Planned:   Additional Equipment:   Intra-op Plan:   Post-operative Plan:   Informed Consent: I have reviewed the patients History and Physical, chart, labs and discussed the procedure including the risks, benefits and alternatives for the proposed anesthesia with the patient or authorized representative who has indicated his/her understanding and acceptance.     Plan Discussed with:   Anesthesia Plan Comments:         Anesthesia Quick Evaluation

## 2017-03-28 LAB — CBC
HEMATOCRIT: 29.4 % — AB (ref 36.0–46.0)
HEMOGLOBIN: 9.6 g/dL — AB (ref 12.0–15.0)
MCH: 27.8 pg (ref 26.0–34.0)
MCHC: 32.7 g/dL (ref 30.0–36.0)
MCV: 85.2 fL (ref 78.0–100.0)
Platelets: 188 10*3/uL (ref 150–400)
RBC: 3.45 MIL/uL — ABNORMAL LOW (ref 3.87–5.11)
RDW: 19.6 % — AB (ref 11.5–15.5)
WBC: 18.7 10*3/uL — AB (ref 4.0–10.5)

## 2017-03-28 NOTE — Progress Notes (Signed)
Patient ID: Sherry Cobb S Jocelyn, female   DOB: 27-Dec-1985, 31 y.o.   MRN: [id number] PPD #1 No problems Afeb, VSS, BP 130-150/80-90 Fundus firm, NT at U-1 Continue routine postpartum care, continue low dose Labetalol for now and monitor BP to see if need to increase dose

## 2017-03-28 NOTE — Anesthesia Postprocedure Evaluation (Signed)
Anesthesia Post Note  Patient: Sherry ManlyLorie S Blethen  Procedure(s) Performed: * No procedures listed *     Patient location during evaluation: Mother Baby Anesthesia Type: Epidural Level of consciousness: awake Pain management: pain level controlled Vital Signs Assessment: post-procedure vital signs reviewed and stable Respiratory status: spontaneous breathing Cardiovascular status: stable Postop Assessment: no headache, no backache, epidural receding and patient able to bend at knees Anesthetic complications: no    Last Vitals:  Vitals:   03/28/17 0100 03/28/17 0510  BP: 136/79 (!) 141/84  Pulse: 94 79  Resp: 18 20  Temp: 37 C 36.6 C    Last Pain:  Vitals:   03/28/17 0510  TempSrc: Oral  PainSc:    Pain Goal:                 Edison PaceWILKERSON,Calysta Craigo

## 2017-03-28 NOTE — Lactation Note (Signed)
This note was copied from a baby's chart. Lactation Consultation Note  Patient Name: Sherry Cobb LungerLorie Vanleer ZOXWR'UToday's Date: 03/28/2017 Reason for consult: Follow-up assessment  Baby 28 hours old. Mom's breasts are filling, and feel tight but not overly firm. Mom reports no discomfort. Mom states that her breasts were very engorged and uncomfortable with first child, but milk is flowing well with this baby at the breast. Parents report that first child in NICU and mom pumped. Parents asking if mom needs to pump with this baby. Discussed progression of milk coming to volume and how and when to introduce a bottle and pump. Enc mom to put baby to breast with cues and to use ice as needed for comfort. Enc mom to offer STS if breasts becoming uncomfortable to enc baby to nurse. Offered to assist with a latch, but mom reports baby nursing well, she is hearing swallows, and baby sleeps--contented--after BF. Enc mom to call for assistance as needed.  Maternal Data    Feeding Feeding Type: Breast Fed  LATCH Score/Interventions Latch: Grasps breast easily, tongue down, lips flanged, rhythmical sucking. (per mom)  Audible Swallowing: A few with stimulation  Type of Nipple: Everted at rest and after stimulation  Comfort (Breast/Nipple): Soft / non-tender (per mom)  Problem noted: Filling (No discomfort per mom.)  Hold (Positioning): No assistance needed to correctly position infant at breast.  LATCH Score: 9  Lactation Tools Discussed/Used     Consult Status Consult Status: Follow-up Date: 03/29/17 Follow-up type: In-patient    Sherlyn HayJennifer D Kiela Shisler 03/28/2017, 9:40 PM

## 2017-03-29 MED ORDER — IBUPROFEN 600 MG PO TABS: 600.0000 mg | ORAL_TABLET | Freq: Four times a day (QID) | ORAL | 0 refills | Status: DC

## 2017-03-29 MED ORDER — LABETALOL HCL 100 MG PO TABS: 100.0000 mg | ORAL_TABLET | Freq: Two times a day (BID) | ORAL | 1 refills | Status: DC

## 2017-03-29 NOTE — Progress Notes (Signed)
Patient ID: [name], female   DOB: [birth year], 31 y.o.   MRN: [id number] PPD #2 Doing well Afeb, VSS, BP normal on Labetalol D/c home

## 2017-03-29 NOTE — Progress Notes (Signed)
I talked with Dr. Karie Schwalbe. Meisinger in order to clarify the duplicate prescription for labetalol. Patient is to continue taking labetalol 100 mg. PO b.i.d.

## 2017-03-29 NOTE — Lactation Note (Signed)
This note was copied from a baby's chart. Lactation Consultation Note  Patient Name: Sherry Cobb UJWJX'BToday's Date: 03/29/2017 Reason for consult: Follow-up assessment;Breast/nipple pain;Hyperbilirubinemia (baby going home today on photo tx )  Baby is 6343 hours old and has been to the breast consistently and if presently breast feeding, LC observed, ( see comments  Below). LC checked on mom and baby due to going home on photo tx. LC recommended due to the jaundice to add post  pumping after 3-4 feedings for 10 - 15 mins , or feed 1st breast 20 mins , supplement with EBM , and post pump,  If the baby is wide awake, able to latch and stay nutritive with feeding baby  Can feed both breast. If the baby is really sluggish due to the jaundice , follow the 1st plan.  Per mom has a DEBP at home. LC instructed mom on the use of shells , and already has comfort gels.  Mother informed of post-discharge support and given phone number to the lactation department, including services for phone call assistance; out-patient appointments; and breastfeeding support group. List of other breastfeeding resources in the community given in the handout. Encouraged mother to call for problems or concerns related to breastfeeding.   Maternal Data    Feeding Feeding Type:  (baby latched in laid back. multiple swallows noted )  LATCH Score/Interventions Latch:  (latched with depth )  Audible Swallowing:  (multiple swallows noted )     Comfort (Breast/Nipple):  (per mom comfortable )  Problem noted: Filling  Hold (Positioning):  (mom independent with latch )     Lactation Tools Discussed/Used Tools: Shells;Comfort gels (instructed on the use of shells )   Consult Status Consult Status: Complete Date: 03/29/17    Sherry Cobb 03/29/2017, 12:08 PM

## 2017-03-29 NOTE — Lactation Note (Signed)
This note was copied from a baby's chart. Lactation Consultation Note Experienced BF mom states BF this baby going well. Mom states breast are filling. Massaging while BF baby, cluster fed during the night.  Mom denied questions, reminded to call for questions or concerns. Comfort gels given for soreness d/t cluster feeding.  Reviewed engorgement and prevention.  Patient Name: Sherry Catalina LungerLorie Kirstein ZHYQM'VToday's Date: 03/29/2017 Reason for consult: Follow-up assessment   Maternal Data    Feeding Feeding Type: Breast Fed Length of feed: 15 min (still BF)  LATCH Score/Interventions Latch: Grasps breast easily, tongue down, lips flanged, rhythmical sucking. Intervention(s): Skin to skin  Audible Swallowing: Spontaneous and intermittent Intervention(s): Alternate breast massage  Type of Nipple: Everted at rest and after stimulation  Comfort (Breast/Nipple): Filling, red/small blisters or bruises, mild/mod discomfort  Problem noted: Filling Interventions (Filling): Frequent nursing  Hold (Positioning): No assistance needed to correctly position infant at breast.  LATCH Score: 9  Lactation Tools Discussed/Used Tools: Comfort gels   Consult Status Consult Status: Complete Date: 03/29/17 Follow-up type: In-patient    Sherry Cobb, [name] 03/29/2017, 7:30 AM

## 2017-03-29 NOTE — Discharge Summary (Signed)
OB Discharge Summary     Patient Name: Sherry Cobb DOB: [birth year] MRN: [id number]  Date of admission: 03/27/2017 Delivering MD: Huel CoteICHARDSON, KATHY   Date of discharge: 03/29/2017  Admitting diagnosis: INDUCTION Intrauterine pregnancy: 1217w0d     Secondary diagnosis:  Active Problems:   Gestational hypertension w/o significant proteinuria in 3rd trimester   VBAC, delivered      Discharge diagnosis: Term Pregnancy Delivered, VBAC and Gestational Hypertension                                   Hospital course:  Induction of Labor With Vaginal Delivery   31 y.o. yo [id number] at 6717w0d was admitted to the hospital 03/27/2017 for induction of labor.  Indication for induction: Favorable cervix at term and Gestational hypertension.  Patient had an uncomplicated labor course as follows: Membrane Rupture Time/Date: 8:59 AM ,03/27/2017   Intrapartum Procedures: Episiotomy: None [1]                                         Lacerations:  2nd degree [3]  Patient had delivery of a Viable infant.  Information for the patient's newborn:  Sherry Cobb [[id number]][[id number]]  Delivery Method: VBAC, Spontaneous (Filed from Delivery Summary)   03/27/2017  Details of delivery can be found in separate delivery note.  Patient had a routine postpartum course. BP almost all normal on low dose labetalol.  Patient is discharged home 03/29/17.  Physical exam  Vitals:   03/28/17 1058 03/28/17 1842 03/28/17 2113 03/29/17 0520  BP: 130/80 (!) 142/83 (!) 145/77 125/79  Pulse: (!) 102 86 87 85  Resp:  20 18 18   Temp:  98.4 F (36.9 C)  98.2 F (36.8 C)  TempSrc:  Oral  Oral  SpO2:      Weight:      Height:       General: alert Lochia: appropriate Uterine Fundus: firm  Labs: Lab Results  Component Value Date   WBC 18.7 (H) 03/28/2017   HGB 9.6 (L) 03/28/2017   HCT 29.4 (L) 03/28/2017   MCV 85.2 03/28/2017   PLT 188 03/28/2017   CMP Latest Ref Rng & Units 03/27/2017  Glucose 65 - 99 mg/dL 99  BUN 6 - 20  mg/dL 10  Creatinine 9.620.44 - 9.521.00 mg/dL 8.410.60  Sodium 324135 - 401145 mmol/L 136  Potassium 3.5 - 5.1 mmol/L 3.9  Chloride 101 - 111 mmol/L 105  CO2 22 - 32 mmol/L 21(L)  Calcium 8.9 - 10.3 mg/dL 0.2(V8.6(L)  Total Protein 6.5 - 8.1 g/dL 7.0  Total Bilirubin 0.3 - 1.2 mg/dL 2.5(D0.2(L)  Alkaline Phos 38 - 126 U/L 208(H)  AST 15 - 41 U/L 19  ALT 14 - 54 U/L 14    Discharge instruction: per After Visit Summary and "Baby and Me Booklet".  After visit meds:  Allergies as of 03/29/2017   No Known Allergies     Medication List    STOP taking these medications   aspirin EC 81 MG tablet     TAKE these medications   diphenhydrAMINE 25 MG tablet Commonly known as:  BENADRYL Take 25 mg by mouth every 6 (six) hours as needed for allergies or sleep.   diphenhydramine-acetaminophen 25-500 MG Tabs tablet Commonly known as:  TYLENOL PM Take 1 tablet by mouth at  bedtime as needed (sleep).   ibuprofen 600 MG tablet Commonly known as:  ADVIL,MOTRIN Take 1 tablet (600 mg total) by mouth every 6 (six) hours.   labetalol 100 MG tablet Commonly known as:  NORMODYNE Take 1 tablet (100 mg total) by mouth 2 (two) times daily. What changed:  Another medication with the same name was added. Make sure you understand how and when to take each.   labetalol 100 MG tablet Commonly known as:  NORMODYNE Take 1 tablet (100 mg total) by mouth 2 (two) times daily. What changed:  You were already taking a medication with the same name, and this prescription was added. Make sure you understand how and when to take each.   loratadine 10 MG tablet Commonly known as:  CLARITIN Take 10 mg by mouth daily as needed for allergies.   prenatal multivitamin Tabs tablet Take 1 tablet by mouth at bedtime.   VENTOLIN HFA 108 (90 Base) MCG/ACT inhaler Generic drug:  albuterol Inhale 1 puff into the lungs daily as needed for allergies.       Diet: routine diet  Activity: Advance as tolerated. Pelvic rest for 6 weeks.    Outpatient follow up:6 days   Newborn Data: Live born female  Birth Weight: 6 lb 8.6 oz (2965 g) APGAR: 7, 9  Baby Feeding: Breast Disposition:home with mother   03/29/2017 Zenaida Niece, MD

## 2017-03-29 NOTE — Progress Notes (Addendum)
RN called Dr Jackelyn KnifeMeisinger regarding patient's blood pressure of 145/77 and  PIH assessment findings of Brisk reflexes of 2 and a dull headache.  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  Patient's systolic  Blood pressures have mostly been in the 130s to 140s . Diastolics have been wnl. Patient is currently on 100 mg of  labetalol BID.  Dr. Jackelyn KnifeMeisinger would like to be called  If patient's blood pressure is 150/100 and or if PIH findings increase.

## 2017-03-29 NOTE — Discharge Instructions (Signed)
As per discharge pamphlet °

## 2018-02-05 ENCOUNTER — Other Ambulatory Visit: Payer: Self-pay

## 2018-02-05 ENCOUNTER — Emergency Department (HOSPITAL_COMMUNITY): Payer: Managed Care, Other (non HMO)

## 2018-02-05 ENCOUNTER — Emergency Department (HOSPITAL_COMMUNITY)
Admission: EM | Admit: 2018-02-05 | Discharge: 2018-02-05 | Disposition: A | Discharge status: Home / Self Care | Payer: Managed Care, Other (non HMO) | Attending: Emergency Medicine | Admitting: Emergency Medicine

## 2018-02-05 ENCOUNTER — Encounter (HOSPITAL_COMMUNITY): Payer: Self-pay

## 2018-02-05 DIAGNOSIS — Z79899 Other long term (current) drug therapy: Secondary | ICD-10-CM | POA: Diagnosis not present

## 2018-02-05 DIAGNOSIS — R1032 Left lower quadrant pain: Secondary | ICD-10-CM | POA: Diagnosis present

## 2018-02-05 DIAGNOSIS — N23 Unspecified renal colic: Secondary | ICD-10-CM

## 2018-02-05 LAB — URINALYSIS, ROUTINE W REFLEX MICROSCOPIC
Bilirubin Urine: NEGATIVE
Glucose, UA: NEGATIVE mg/dL
KETONES UR: NEGATIVE mg/dL
Leukocytes, UA: NEGATIVE
Nitrite: NEGATIVE
PROTEIN: NEGATIVE mg/dL
Specific Gravity, Urine: 1.004 — ABNORMAL LOW (ref 1.005–1.030)
pH: 9 — ABNORMAL HIGH (ref 5.0–8.0)

## 2018-02-05 LAB — HEPATIC FUNCTION PANEL
ALT: 15 U/L (ref 14–54)
AST: 18 U/L (ref 15–41)
Albumin: 4.4 g/dL (ref 3.5–5.0)
Alkaline Phosphatase: 70 U/L (ref 38–126)
BILIRUBIN DIRECT: 0.2 mg/dL (ref 0.1–0.5)
Indirect Bilirubin: 1 mg/dL — ABNORMAL HIGH (ref 0.3–0.9)
TOTAL PROTEIN: 7.7 g/dL (ref 6.5–8.1)
Total Bilirubin: 1.2 mg/dL (ref 0.3–1.2)

## 2018-02-05 LAB — BASIC METABOLIC PANEL
ANION GAP: 12 (ref 5–15)
BUN: 7 mg/dL (ref 6–20)
CO2: 24 mmol/L (ref 22–32)
Calcium: 9.3 mg/dL (ref 8.9–10.3)
Chloride: 102 mmol/L (ref 101–111)
Creatinine, Ser: 0.86 mg/dL (ref 0.44–1.00)
GFR calc Af Amer: >60 mL/min (ref >60)
GLUCOSE: 133 mg/dL — AB (ref 65–99)
Potassium: 3.6 mmol/L (ref 3.5–5.1)
SODIUM: 138 mmol/L (ref 135–145)

## 2018-02-05 LAB — CBC
HCT: 43.5 % (ref 36.0–46.0)
Hemoglobin: 14.3 g/dL (ref 12.0–15.0)
MCH: 28 pg (ref 26.0–34.0)
MCHC: 32.9 g/dL (ref 30.0–36.0)
MCV: 85.1 fL (ref 78.0–100.0)
PLATELETS: 320 10*3/uL (ref 150–400)
RBC: 5.11 MIL/uL (ref 3.87–5.11)
RDW: 12.8 % (ref 11.5–15.5)
WBC: 8.5 10*3/uL (ref 4.0–10.5)

## 2018-02-05 LAB — LIPASE, BLOOD: Lipase: 30 U/L (ref 11–51)

## 2018-02-05 LAB — I-STAT BETA HCG BLOOD, ED (MC, WL, AP ONLY)

## 2018-02-05 MED ORDER — ONDANSETRON 4 MG PO TBDP: 4.0000 mg | ORAL_TABLET | Freq: Three times a day (TID) | ORAL | 0 refills | Status: DC | PRN

## 2018-02-05 MED ORDER — HYDROMORPHONE HCL 2 MG/ML IJ SOLN
1.0000 mg | Freq: Once | INTRAMUSCULAR | Status: AC
  Administered 2018-02-05: 1 mg via INTRAVENOUS
  Filled 2018-02-05: qty 1

## 2018-02-05 MED ORDER — IBUPROFEN 600 MG PO TABS: 600.0000 mg | ORAL_TABLET | Freq: Three times a day (TID) | ORAL | 0 refills | Status: AC

## 2018-02-05 MED ORDER — SODIUM CHLORIDE 0.9 % IV BOLUS
1000.0000 mL | Freq: Once | INTRAVENOUS | Status: AC
  Administered 2018-02-05: 1000 mL via INTRAVENOUS

## 2018-02-05 MED ORDER — OXYCODONE-ACETAMINOPHEN 5-325 MG PO TABS
2.0000 | ORAL_TABLET | Freq: Once | ORAL | Status: AC
  Administered 2018-02-05: 2 via ORAL
  Filled 2018-02-05: qty 2

## 2018-02-05 MED ORDER — LABETALOL HCL 200 MG PO TABS
100.0000 mg | ORAL_TABLET | Freq: Once | ORAL | Status: AC
  Administered 2018-02-05: 100 mg via ORAL
  Filled 2018-02-05: qty 1

## 2018-02-05 MED ORDER — OXYCODONE-ACETAMINOPHEN 5-325 MG PO TABS: 1.0000 | ORAL_TABLET | Freq: Four times a day (QID) | ORAL | 0 refills | Status: DC | PRN

## 2018-02-05 MED ORDER — ONDANSETRON HCL 4 MG/2ML IJ SOLN
4.0000 mg | Freq: Once | INTRAMUSCULAR | Status: AC
  Administered 2018-02-05: 4 mg via INTRAVENOUS
  Filled 2018-02-05: qty 2

## 2018-02-05 MED ORDER — KETOROLAC TROMETHAMINE 15 MG/ML IJ SOLN
15.0000 mg | Freq: Once | INTRAMUSCULAR | Status: AC
  Administered 2018-02-05: 15 mg via INTRAVENOUS
  Filled 2018-02-05: qty 1

## 2018-02-05 NOTE — ED Provider Notes (Signed)
[facility] EMERGENCY DEPARTMENT Provider Note   CSN: [id number] Arrival date & time: 02/05/18  8295     History   Chief Complaint No chief complaint on file.   HPI Sherry Cobb is a 32 y.o. female.  HPI 32 year old female with past medical history as below here with severe left lower quadrant pain.  The patient states that she awoke this morning and then developed acute, severe, aching, left adnexal pain.  The pain began acutely.  She has had associated nausea and vomiting.  She has a history of kidney stones but states these usually began higher up with a warning sign, and today is more severe, acute, and lower than her previous stents.  Denies any dysuria or hematuria, though she does have some frequency.  Denies any vaginal bleeding.  Of note, however, the patient has had irregular periods since restarting birth control after recent birth of her child.  No known history of ovarian cyst.  No fevers or chills.  No dyspareunia.  No high risk sexual activity.  Pain is worse with any kind of movement and palpation.  No alleviating factors.  Past Medical History:  Diagnosis Date  . Kidney stones   . Preeclampsia 2014  . Pregnancy induced hypertension   . Preterm labor     Patient Active Problem List   Diagnosis Date Noted  . Gestational hypertension w/o significant proteinuria in 3rd trimester 03/27/2017  . VBAC, delivered 03/27/2017  . Preeclampsia 10/01/2013    Past Surgical History:  Procedure Laterality Date  . CESAREAN SECTION N/A 10/01/2013   Procedure: Primary Cesarean Section Delivery Baby Girl @ [phone], Apgars 5/7;  Surgeon: Bing Plume, MD;  Location: [facility];  Service: Obstetrics;  Laterality: N/A;  . NOSE SURGERY    . WISDOM TOOTH EXTRACTION       OB History    Gravida  2   Para  2   Term  1   Preterm  1   AB      Living  2     SAB      TAB      Ectopic      Multiple  0   Live Births  2            Home Medications      Prior to Admission medications   Medication Sig Start Date End Date Taking? Authorizing Provider  aspirin-acetaminophen-caffeine (EXCEDRIN MIGRAINE) [phone] MG tablet Take 2 tablets by mouth as needed for headache.   Yes [provider]  diphenhydrAMINE (BENADRYL) 25 MG tablet Take 25 mg by mouth every 6 (six) hours as needed for allergies or sleep.    Yes [provider]  labetalol (NORMODYNE) 100 MG tablet Take 1 tablet (100 mg total) by mouth 2 (two) times daily. 02/20/17  Yes Judeth Horn, NP  loratadine (CLARITIN) 10 MG tablet Take 10 mg by mouth daily as needed for allergies.   Yes [provider]  norethindrone-ethinyl estradiol (JUNEL 1/20) 1-20 MG-MCG tablet Take 1 tablet by mouth daily.   Yes [provider]  sertraline (ZOLOFT) 50 MG tablet Take 50 mg by mouth daily. 02/02/16  Yes [provider]  ibuprofen (ADVIL,MOTRIN) 600 MG tablet Take 1 tablet (600 mg total) by mouth 3 (three) times daily for 5 days. 02/05/18 02/10/18  Shaune Pollack, MD  labetalol (NORMODYNE) 100 MG tablet Take 1 tablet (100 mg total) by mouth 2 (two) times daily. Patient not taking: Reported on 02/05/2018  03/29/17   Meisinger, Tawanna Cooler, MD  ondansetron (ZOFRAN ODT) 4 MG disintegrating tablet Take 1 tablet (4 mg total) by mouth every 8 (eight) hours as needed for nausea or vomiting. 02/05/18   Shaune Pollack, MD  oxyCODONE-acetaminophen (PERCOCET/ROXICET) 5-325 MG tablet Take 1-2 tablets by mouth every 6 (six) hours as needed for moderate pain or severe pain. 02/05/18   Shaune Pollack, MD    Family History Family History  Problem Relation Age of Onset  . Heart disease Maternal Grandmother   . Heart disease Maternal Grandfather   . Heart disease Paternal Grandmother   . Heart disease Paternal Grandfather   . Hypertension Mother   . Hypertension Father   . Diabetes Father   . Cancer Father   . Kidney disease Father     Social History Social History   Tobacco Use   . Smoking status: Never Smoker  . Smokeless tobacco: Never Used  Substance Use Topics  . Alcohol use: Yes    Comment: not while pregnant  . Drug use: No     Allergies   Patient has no known allergies.   Review of Systems Review of Systems  Gastrointestinal: Positive for abdominal pain, nausea and vomiting.  Genitourinary: Positive for frequency and pelvic pain.  All other systems reviewed and are negative.    Physical Exam Updated Vital Signs BP (!) 178/92 (BP Location: Left Arm)   Pulse 98   Temp 98.1 F (36.7 C) (Oral)   Resp 18   SpO2 100%   Physical Exam  Constitutional: She is oriented to person, place, and time. She appears well-developed and well-nourished. She appears distressed (Tearful, in pain).  HENT:  Head: Normocephalic and atraumatic.  Eyes: Conjunctivae are normal.  Neck: Neck supple.  Cardiovascular: Normal rate, regular rhythm and normal heart sounds. Exam reveals no friction rub.  No murmur heard. Pulmonary/Chest: Effort normal and breath sounds normal. No respiratory distress. She has no wheezes. She has no rales.  Abdominal: She exhibits no distension. There is tenderness in the left lower quadrant. There is guarding. There is no rigidity and no rebound.  Musculoskeletal: She exhibits no edema.  Neurological: She is alert and oriented to person, place, and time. She exhibits normal muscle tone.  Skin: Skin is warm. Capillary refill takes less than 2 seconds.  Psychiatric: She has a normal mood and affect.  Nursing note and vitals reviewed.    ED Treatments / Results  Labs (all labs ordered are listed, but only abnormal results are displayed) Labs Reviewed  URINALYSIS, ROUTINE W REFLEX MICROSCOPIC - Abnormal; Notable for the following components:      Result Value   Color, Urine COLORLESS (*)    Specific Gravity, Urine 1.004 (*)    pH 9.0 (*)    Hgb urine dipstick MODERATE (*)    Bacteria, UA RARE (*)    All other components within  normal limits  BASIC METABOLIC PANEL - Abnormal; Notable for the following components:   Glucose, Bld 133 (*)    All other components within normal limits  HEPATIC FUNCTION PANEL - Abnormal; Notable for the following components:   Indirect Bilirubin 1.0 (*)    All other components within normal limits  CBC  LIPASE, BLOOD  I-STAT BETA HCG BLOOD, ED ([facility], [facility], [facility] ONLY)    EKG None  Radiology US Transvaginal Non-ob  Result Date: 02/05/2018 CLINICAL DATA:  Severe left lower quadrant abdominal pain. EXAM: TRANSABDOMINAL AND TRANSVAGINAL ULTRASOUND OF PELVIS DOPPLER ULTRASOUND OF OVARIES TECHNIQUE:  Both transabdominal and transvaginal ultrasound examinations of the pelvis were performed. Transabdominal technique was performed for global imaging of the pelvis including uterus, ovaries, adnexal regions, and pelvic cul-de-sac. It was necessary to proceed with endovaginal exam following the transabdominal exam to visualize the uterus, endometrium, and ovaries. Color and duplex Doppler ultrasound was utilized to evaluate blood flow to the ovaries. COMPARISON:  CT abdomen pelvis dated August 10, 2015. FINDINGS: Uterus Measurements: 8.8 x 3.8 x 5.5 cm. No fibroids or other mass visualized. Trace fluid within the cervical canal. Endometrium Thickness: 5 mm.  No focal abnormality visualized. Right ovary Measurements: 2.9 x 1.9 x 2.0 cm. Normal appearance/no adnexal mass. Left ovary Measurements: 2.4 x 1.7 x 1.9 cm. Normal appearance/no adnexal mass. Pulsed Doppler evaluation of both ovaries demonstrates normal low-resistance arterial and venous waveforms. Other findings No abnormal free fluid. IMPRESSION: 1. Trace fluid within the cervical canal. Otherwise normal pelvic ultrasound. Electronically Signed   By: Obie Dredge M.D.   On: 02/05/2018 10:34   US Pelvis Complete  Result Date: 02/05/2018 CLINICAL DATA:  Severe left lower quadrant abdominal pain. EXAM: TRANSABDOMINAL AND TRANSVAGINAL ULTRASOUND OF  PELVIS DOPPLER ULTRASOUND OF OVARIES TECHNIQUE: Both transabdominal and transvaginal ultrasound examinations of the pelvis were performed. Transabdominal technique was performed for global imaging of the pelvis including uterus, ovaries, adnexal regions, and pelvic cul-de-sac. It was necessary to proceed with endovaginal exam following the transabdominal exam to visualize the uterus, endometrium, and ovaries. Color and duplex Doppler ultrasound was utilized to evaluate blood flow to the ovaries. COMPARISON:  CT abdomen pelvis dated August 10, 2015. FINDINGS: Uterus Measurements: 8.8 x 3.8 x 5.5 cm. No fibroids or other mass visualized. Trace fluid within the cervical canal. Endometrium Thickness: 5 mm.  No focal abnormality visualized. Right ovary Measurements: 2.9 x 1.9 x 2.0 cm. Normal appearance/no adnexal mass. Left ovary Measurements: 2.4 x 1.7 x 1.9 cm. Normal appearance/no adnexal mass. Pulsed Doppler evaluation of both ovaries demonstrates normal low-resistance arterial and venous waveforms. Other findings No abnormal free fluid. IMPRESSION: 1. Trace fluid within the cervical canal. Otherwise normal pelvic ultrasound. Electronically Signed   By: Obie Dredge M.D.   On: 02/05/2018 10:34   US Renal  Result Date: 02/05/2018 CLINICAL DATA:  Left flank pain. EXAM: RENAL / URINARY TRACT ULTRASOUND COMPLETE COMPARISON:  CT 08/10/2015. FINDINGS: Right Kidney: Length: 10.1 cm. Increased echogenicity. No mass or hydronephrosis visualized. Left Kidney: Length: 10.9 cm. Increased echogenicity. No mass. Mild left hydronephrosis visualized. Bladder: Appears normal for degree of bladder distention. Bilateral ureteral jets visualized. IMPRESSION: 1.  Mild left hydronephrosis. 2. Bilateral increased renal echogenicity suggesting chronic medical renal disease. Electronically Signed   By: Maisie Fus  Register   On: 02/05/2018 10:39   [place] Art/ven Flow Abd Pelv Doppler  Result Date: 02/05/2018 CLINICAL DATA:  Severe left  lower quadrant abdominal pain. EXAM: TRANSABDOMINAL AND TRANSVAGINAL ULTRASOUND OF PELVIS DOPPLER ULTRASOUND OF OVARIES TECHNIQUE: Both transabdominal and transvaginal ultrasound examinations of the pelvis were performed. Transabdominal technique was performed for global imaging of the pelvis including uterus, ovaries, adnexal regions, and pelvic cul-de-sac. It was necessary to proceed with endovaginal exam following the transabdominal exam to visualize the uterus, endometrium, and ovaries. Color and duplex Doppler ultrasound was utilized to evaluate blood flow to the ovaries. COMPARISON:  CT abdomen pelvis dated August 10, 2015. FINDINGS: Uterus Measurements: 8.8 x 3.8 x 5.5 cm. No fibroids or other mass visualized. Trace fluid within the cervical canal. Endometrium Thickness: 5 mm.  No focal  abnormality visualized. Right ovary Measurements: 2.9 x 1.9 x 2.0 cm. Normal appearance/no adnexal mass. Left ovary Measurements: 2.4 x 1.7 x 1.9 cm. Normal appearance/no adnexal mass. Pulsed Doppler evaluation of both ovaries demonstrates normal low-resistance arterial and venous waveforms. Other findings No abnormal free fluid. IMPRESSION: 1. Trace fluid within the cervical canal. Otherwise normal pelvic ultrasound. Electronically Signed   By: Obie Dredge M.D.   On: 02/05/2018 10:34    Procedures Procedures (including critical care time)  Medications Ordered in ED Medications  ondansetron (ZOFRAN) injection 4 mg (4 mg Intravenous Given 02/05/18 0922)  HYDROmorphone (DILAUDID) injection 1 mg (1 mg Intravenous Given 02/05/18 0922)  ketorolac (TORADOL) 15 MG/ML injection 15 mg (15 mg Intravenous Given 02/05/18 0929)  sodium chloride 0.9 % bolus 1,000 mL (0 mLs Intravenous Stopped 02/05/18 1213)  oxyCODONE-acetaminophen (PERCOCET/ROXICET) 5-325 MG per tablet 2 tablet (2 tablets Oral Given 02/05/18 1200)  labetalol (NORMODYNE) tablet 100 mg (100 mg Oral Given 02/05/18 1200)     Initial Impression / Assessment and Plan /  ED Course  I have reviewed the triage vital signs and the nursing notes.  Pertinent labs & imaging results that were available during my care of the patient were reviewed by me and considered in my medical decision making (see chart for details).  Clinical Course as of Feb 06 1243  Thu Feb 05, 2018  1460 32 year old female here with acute onset of severe left adnexal pain.  Differential includes ovarian cyst, ovarian torsion, or nephrolithiasis, though she has no pain on the left flank.  Patient did recently restart birth control after a delivery, so cyst is high on differential.  No dyspareunia, discharge, or symptoms to suggest PID.  Will send for stat ultrasound.  Will also include a renal ultrasound given history of stones.  The patient has no history of complicated stones requiring any kind of intervention.   [CI]  [id number] Patient improved after pain control.  Taken to ultrasound.   [CI]    Clinical Course User Index [CI] Shaune Pollack, MD    TVUS u/s shows no acute abnormality, no large cysts, no torsion, no signs of PID or TOA. Renal U/S shows mild/mod hydro on left, c/w suspected stone. UA without UTI. Serial abd exams benign with no other focal TTP. Specifically no RLQ TTP or signs of appy. No vag discharge, no CMT, no signs of PID. Given history c/w stone and reassuring U/S, serial exams, and labs, will d/c with supportive care and good return precautions.  Final Clinical Impressions(s) / ED Diagnoses   Final diagnoses:  None    ED Discharge Orders        Ordered    oxyCODONE-acetaminophen (PERCOCET/ROXICET) 5-325 MG tablet  Every 6 hours PRN     02/05/18 1212    ondansetron (ZOFRAN ODT) 4 MG disintegrating tablet  Every 8 hours PRN     02/05/18 1212    ibuprofen (ADVIL,MOTRIN) 600 MG tablet  3 times daily     02/05/18 1212       Shaune Pollack, MD 02/05/18 1245

## 2018-02-05 NOTE — ED Triage Notes (Signed)
Patient complains of acute onset of LLQ abdominal pain, arrived hyperventilating and appears anxious. States that she has remote hx of kidney stone and thinks this may be same

## 2018-02-05 NOTE — ED Notes (Signed)
Pt stating that she is frustrated about having to be in a hallway bed. Pt stating that she is unsure of what is going on and is requesting an update from the nurse. Pt states that she takes labetalol and is requesting we give her a dosage here. Pt also states that the medication is wearing off and is having some pain. RN notified.

## 2018-06-16 DIAGNOSIS — J329 Chronic sinusitis, unspecified: Secondary | ICD-10-CM | POA: Diagnosis not present

## 2018-06-16 DIAGNOSIS — J310 Chronic rhinitis: Secondary | ICD-10-CM | POA: Diagnosis not present

## 2018-06-16 DIAGNOSIS — J302 Other seasonal allergic rhinitis: Secondary | ICD-10-CM | POA: Diagnosis not present

## 2018-06-16 DIAGNOSIS — J3489 Other specified disorders of nose and nasal sinuses: Secondary | ICD-10-CM | POA: Diagnosis not present

## 2018-06-16 DIAGNOSIS — J0141 Acute recurrent pansinusitis: Secondary | ICD-10-CM | POA: Diagnosis not present

## 2018-06-25 IMAGING — US US ART/VEN ABD/PELV/SCROTUM DOPPLER LTD
1 series · 13 of 25 positions shown · non-contrast
Comparison: CT abdomen pelvis dated August 10, 2015.

CLINICAL DATA: Severe left lower quadrant abdominal pain.

EXAM:
TRANSABDOMINAL AND TRANSVAGINAL ULTRASOUND OF PELVIS
DOPPLER ULTRASOUND OF OVARIES
TECHNIQUE: Both transabdominal and transvaginal ultrasound examinations of the
pelvis were performed. Transabdominal technique was performed for
global imaging of the pelvis including uterus, ovaries, adnexal
regions, and pelvic cul-de-sac.
It was necessary to proceed with endovaginal exam following the
transabdominal exam to visualize the uterus, endometrium, and
ovaries. Color and duplex Doppler ultrasound was utilized to
evaluate blood flow to the ovaries.

[Series 1: us art/ven abd/pelv/scrotum doppler ltd · 0.19mm/px · 13 of 74 slices shown]
[im 1/74]
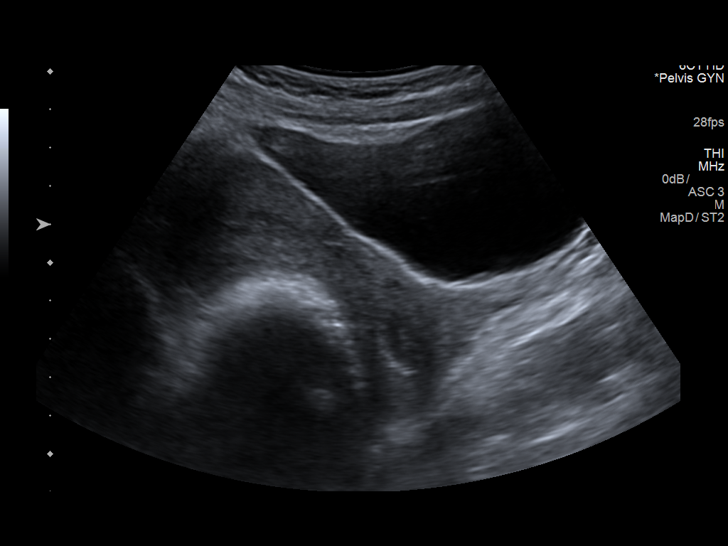
[im 7/74]
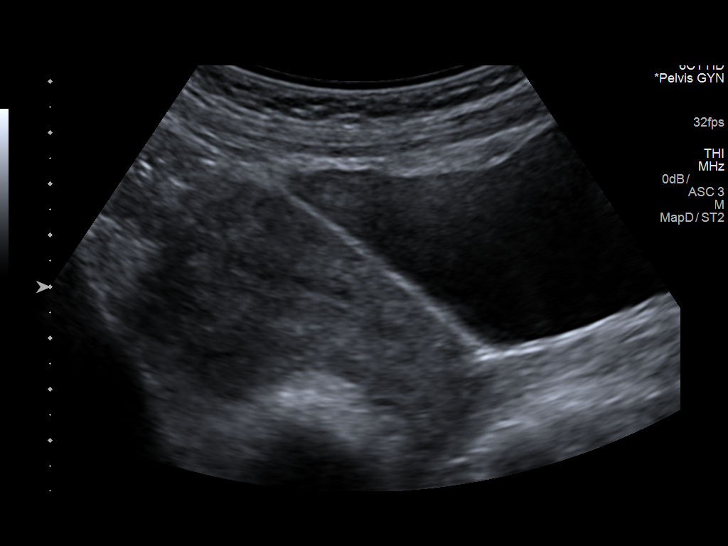
[im 13/74]
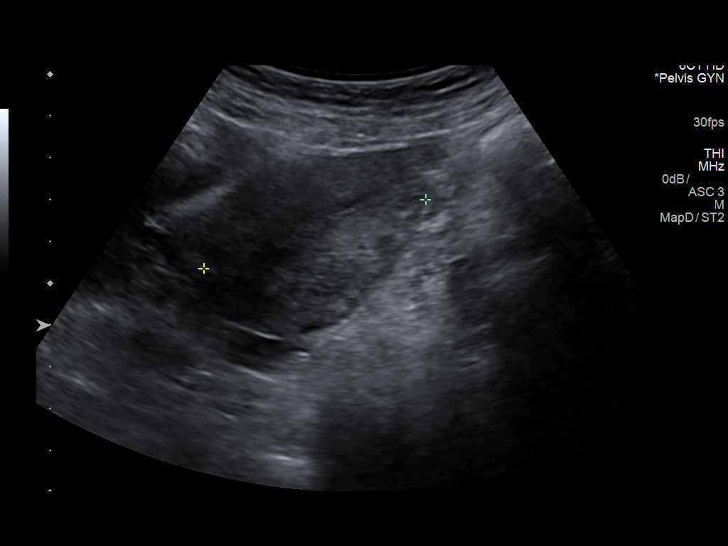
[im 19/74]
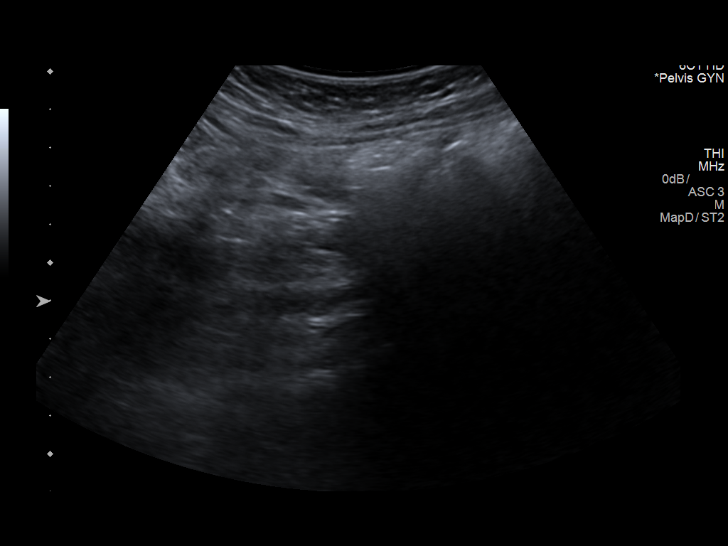
[im 25/74]
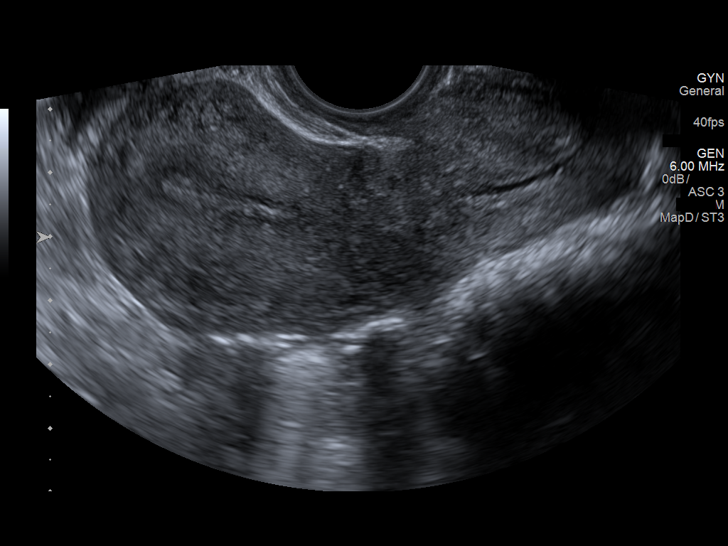
[im 31/74]
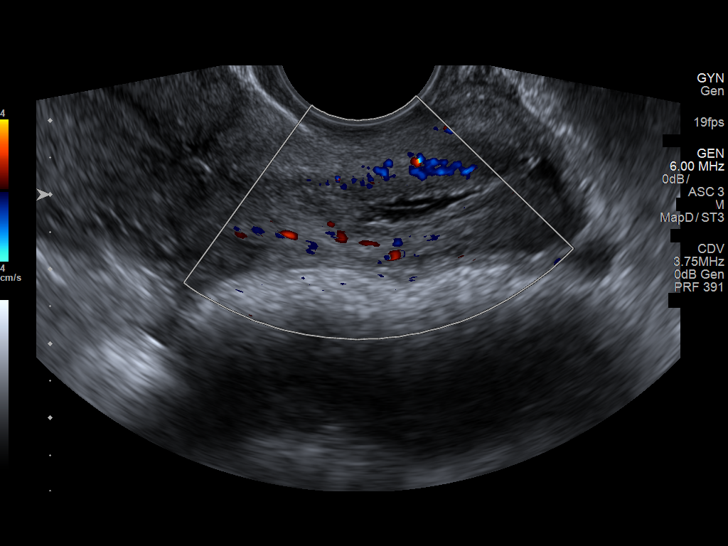
[im 37/74]
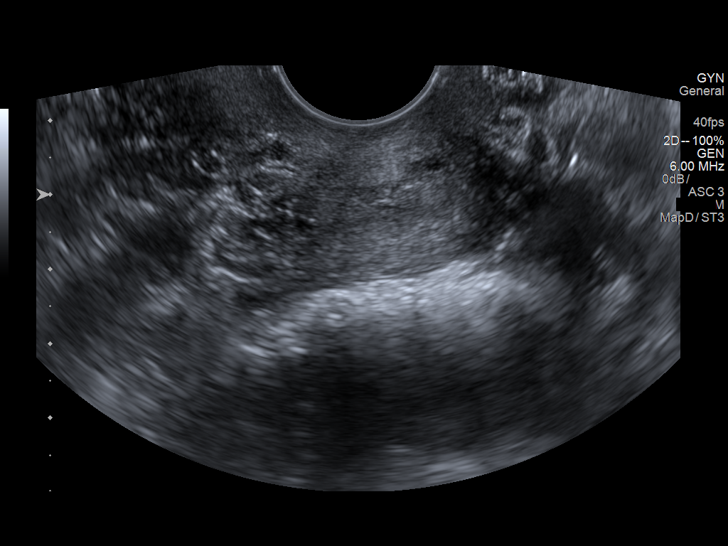
[im 43/74]
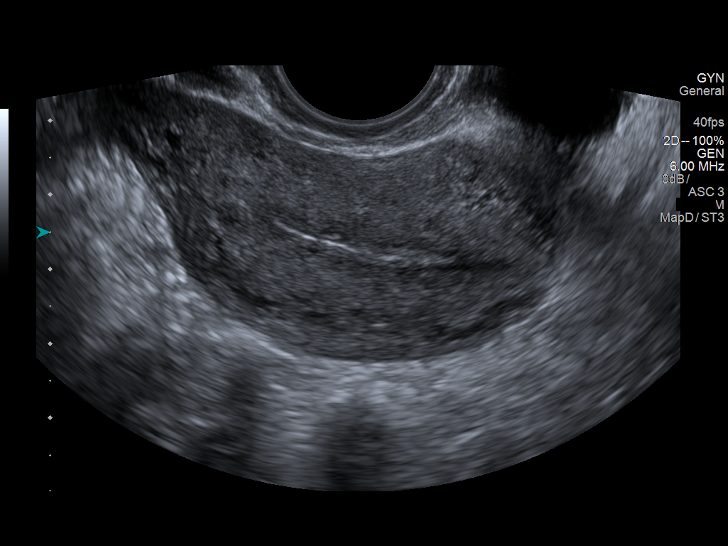
[im 49/74]
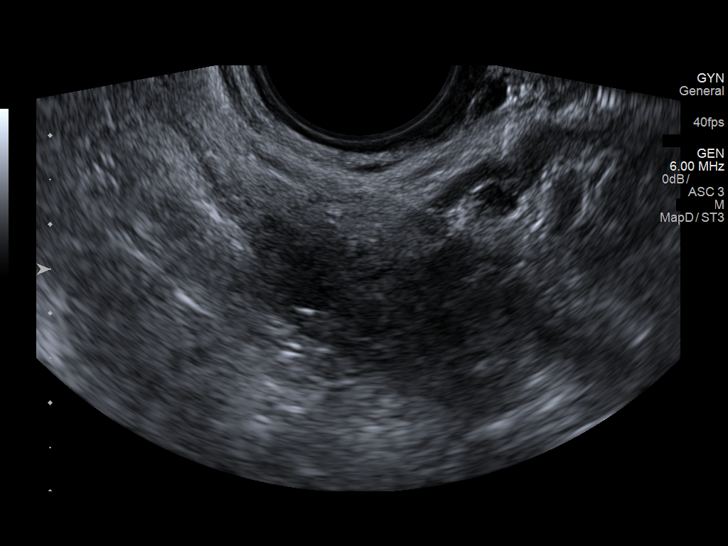
[im 55/74]
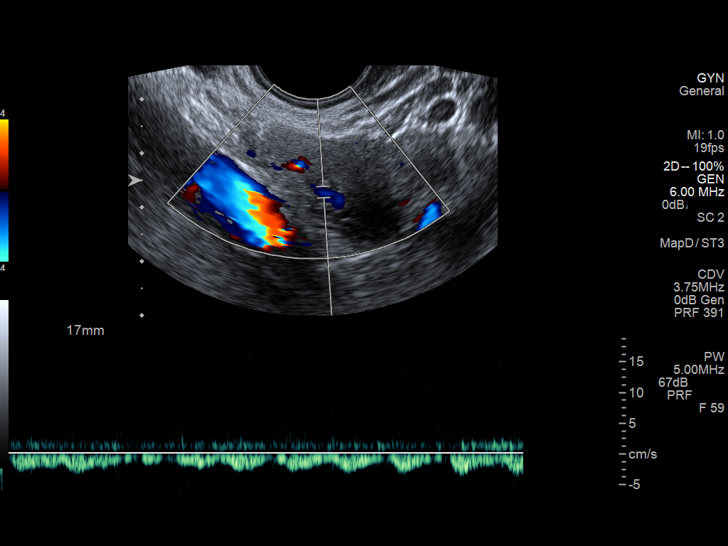
[im 61/74]
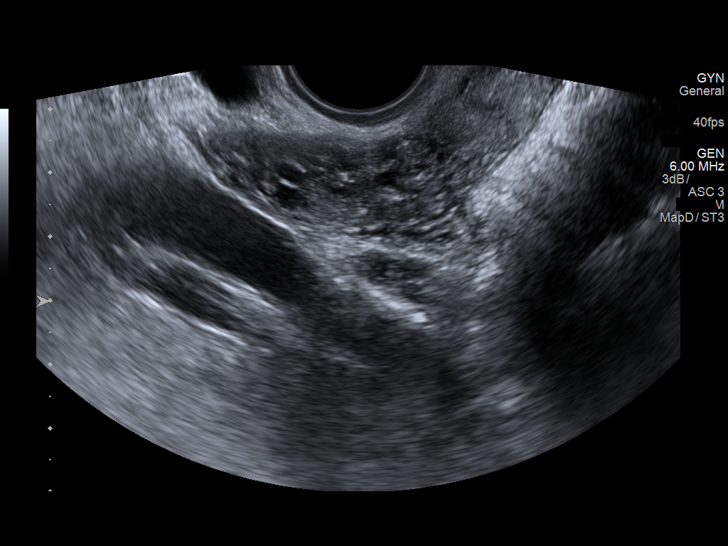
[im 67/74]
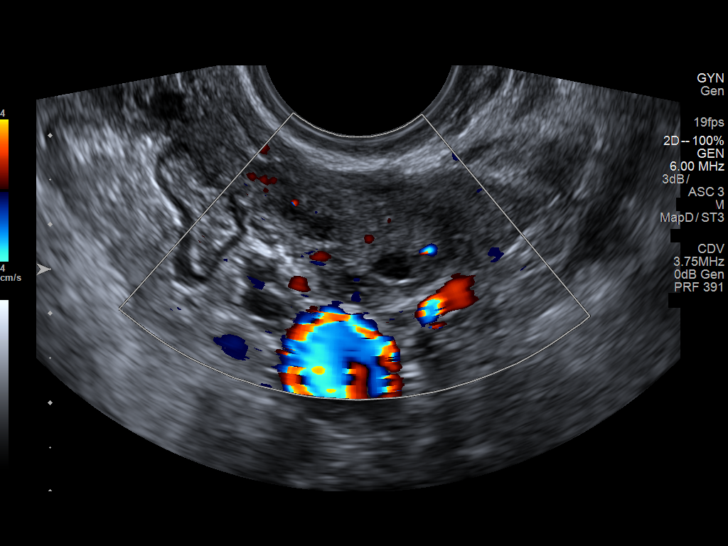
[im 74/74]
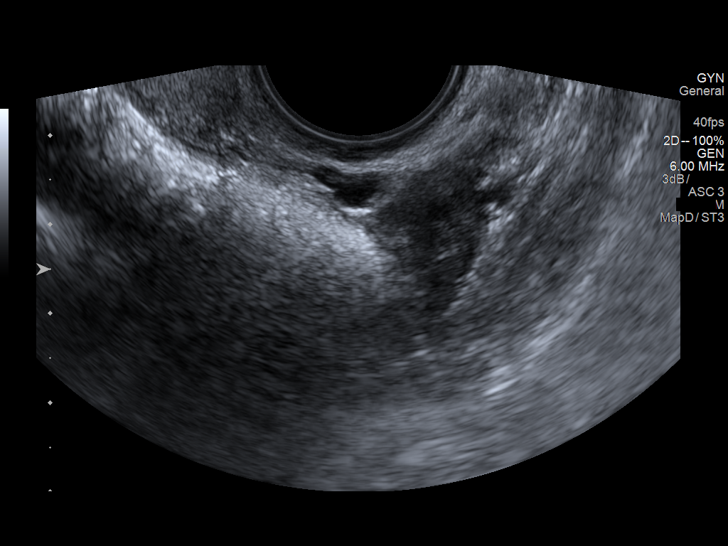

[13 of 25 positions shown; findings below may reference images not displayed]

FINDINGS: Uterus

Measurements: 8.8 x 3.8 x 5.5 cm. No fibroids or other mass
visualized. Trace fluid within the cervical canal.

Endometrium

Thickness: 5 mm.  No focal abnormality visualized.

Right ovary

Measurements: 2.9 x 1.9 x 2.0 cm. Normal appearance/no adnexal mass.

Left ovary

Measurements: 2.4 x 1.7 x 1.9 cm. Normal appearance/no adnexal mass.

Pulsed Doppler evaluation of both ovaries demonstrates normal
low-resistance arterial and venous waveforms.

Other findings

No abnormal free fluid.
IMPRESSION: 1. Trace fluid within the cervical canal. Otherwise normal pelvic
ultrasound.

## 2018-06-25 IMAGING — US US RENAL
1 series · 14 of 25 positions shown · non-contrast
Comparison: CT 08/10/2015.

CLINICAL DATA: Left flank pain.

EXAM:
RENAL / URINARY TRACT ULTRASOUND COMPLETE

[Series 1: us renal · 0.26mm/px · 14 of 50 slices shown]
[im 1/50]
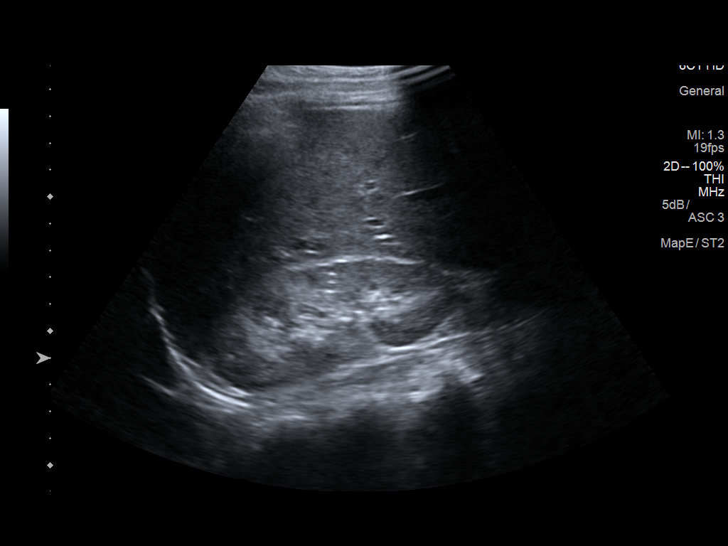
[im 5/50]
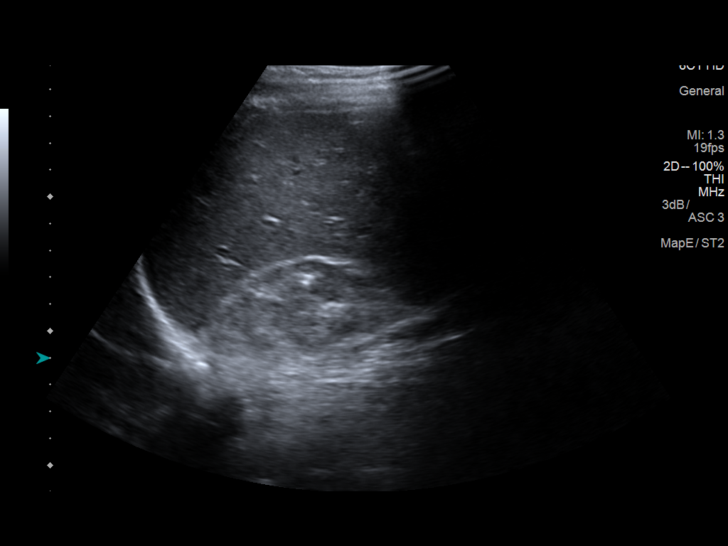
[im 9/50]
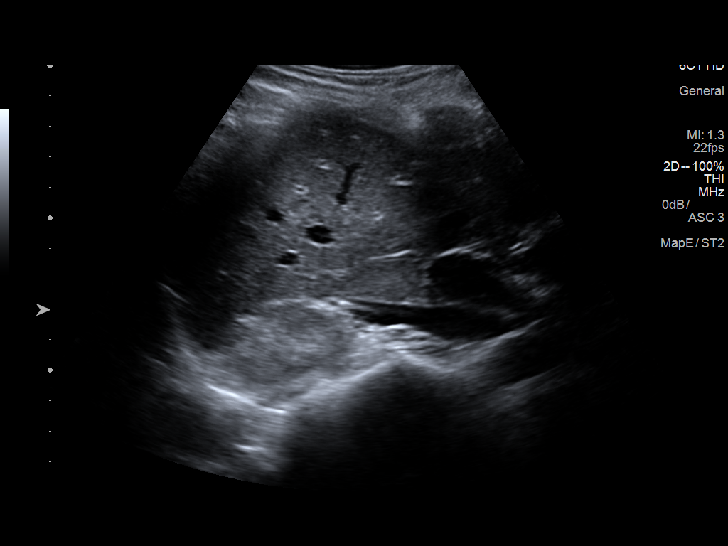
[im 13/50]
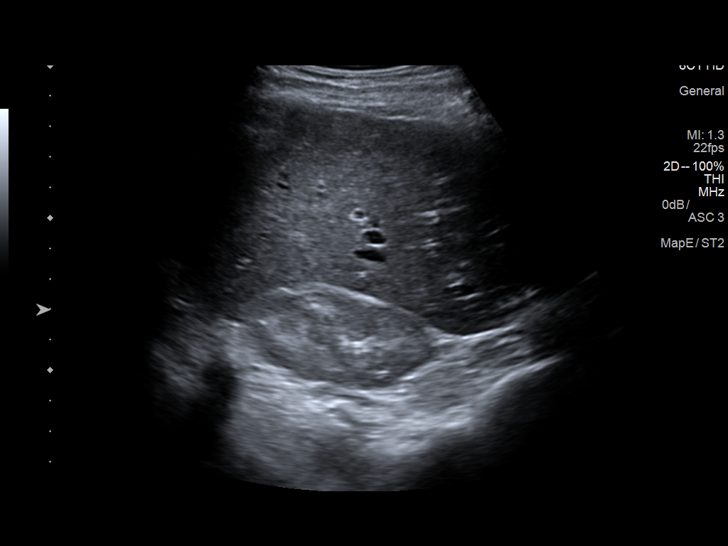
[im 17/50]
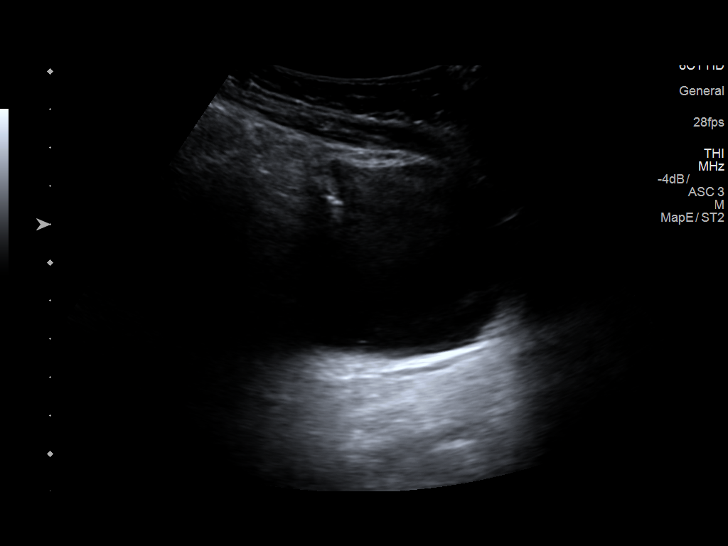
[im 19/50]
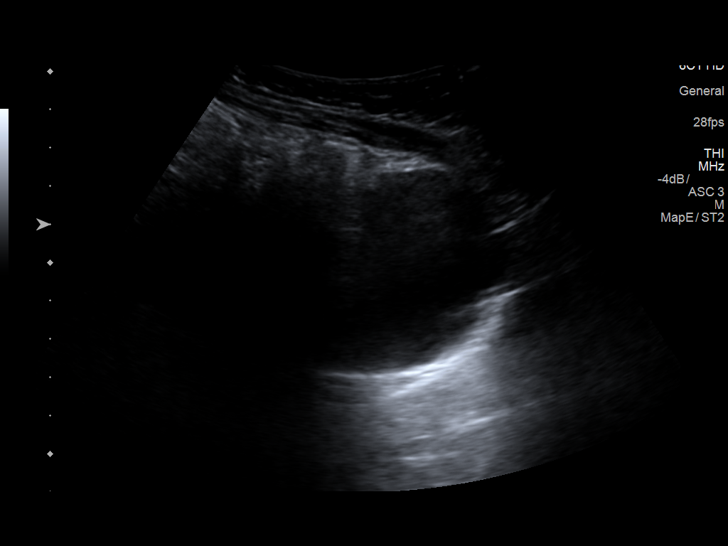
[im 23/50]
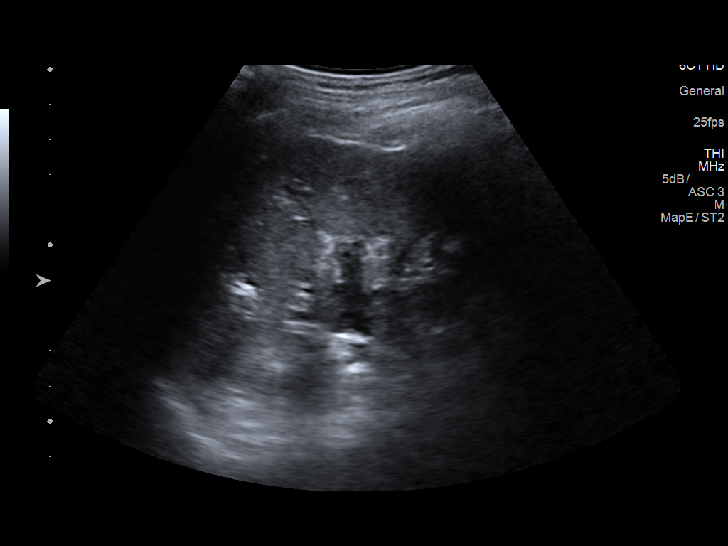
[im 27/50]
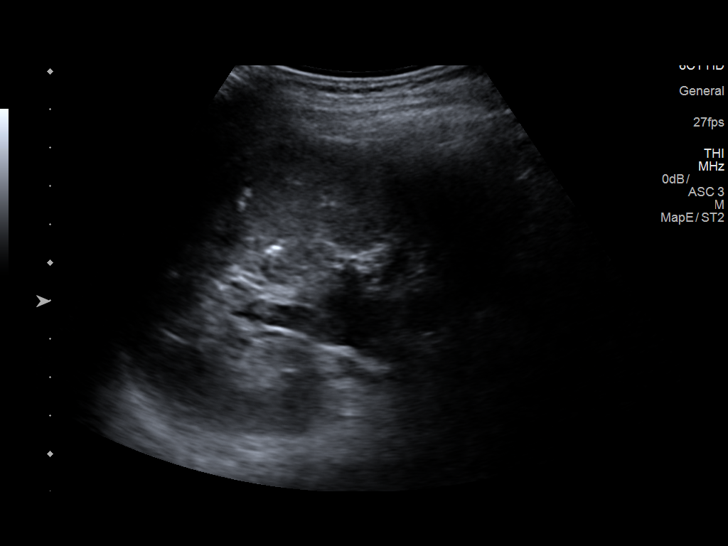
[im 31/50]
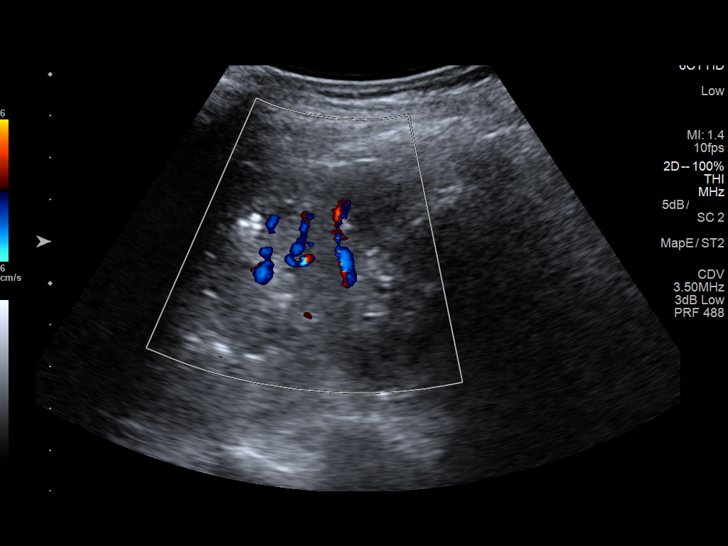
[im 33/50]
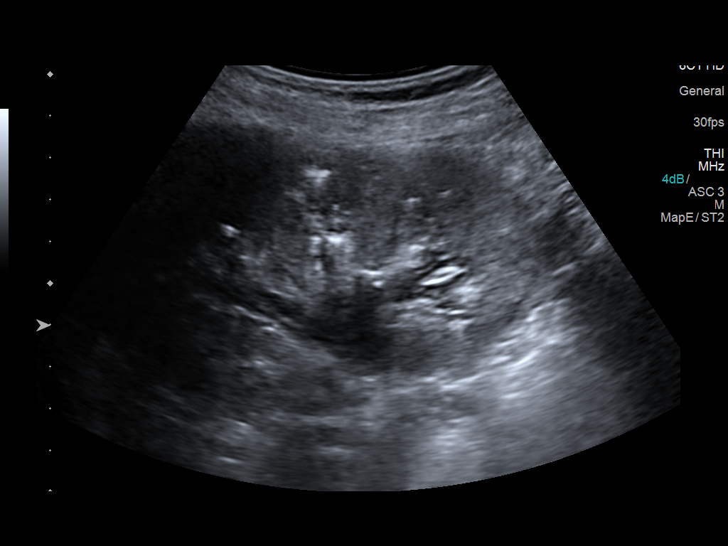
[im 37/50]
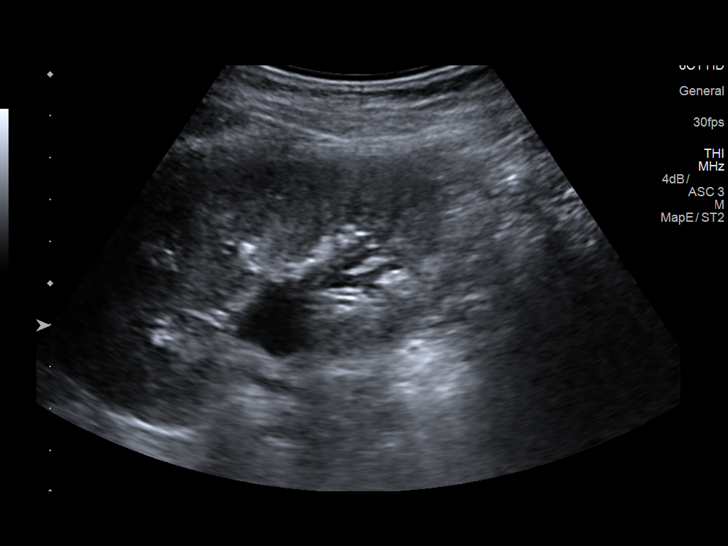
[im 41/50]
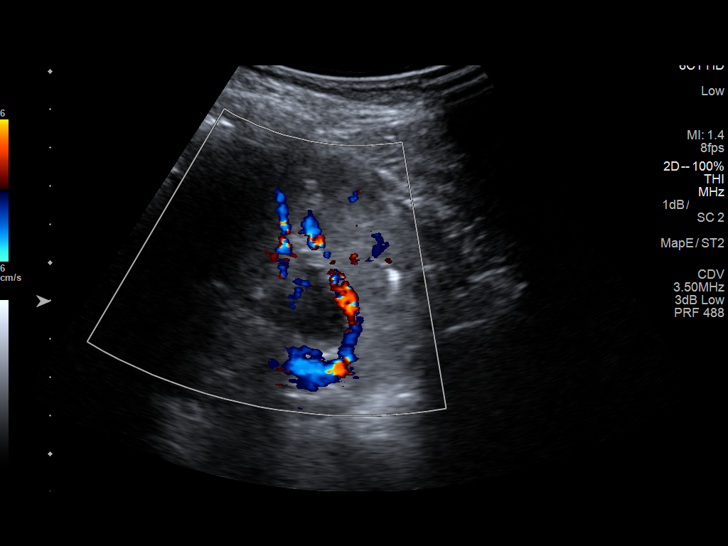
[im 45/50]
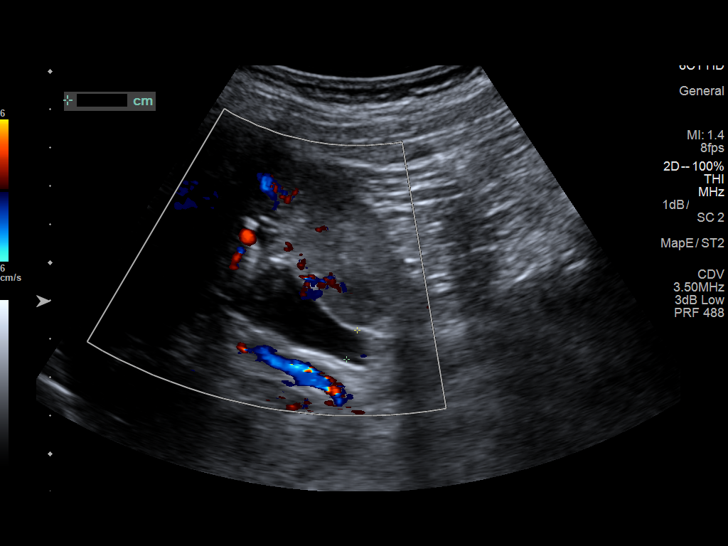
[im 50/50]
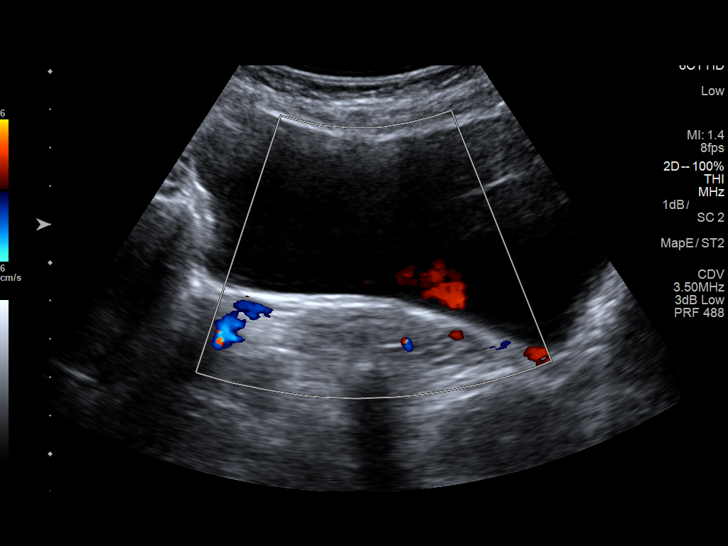

[14 of 25 positions shown; findings below may reference images not displayed]

FINDINGS: Right Kidney:

Length: 10.1 cm. Increased echogenicity. No mass or hydronephrosis
visualized.

Left Kidney:

Length: 10.9 cm. Increased echogenicity. No mass. Mild left
hydronephrosis visualized.

Bladder:

Appears normal for degree of bladder distention. Bilateral ureteral
jets visualized.
IMPRESSION: 1.  Mild left hydronephrosis.

2. Bilateral increased renal echogenicity suggesting chronic medical
renal disease.

## 2018-07-14 DIAGNOSIS — J3089 Other allergic rhinitis: Secondary | ICD-10-CM | POA: Diagnosis not present

## 2018-10-23 DIAGNOSIS — I10 Essential (primary) hypertension: Secondary | ICD-10-CM | POA: Diagnosis not present

## 2018-10-23 DIAGNOSIS — R002 Palpitations: Secondary | ICD-10-CM | POA: Diagnosis not present

## 2018-11-20 DIAGNOSIS — L718 Other rosacea: Secondary | ICD-10-CM | POA: Diagnosis not present

## 2018-11-20 DIAGNOSIS — D2261 Melanocytic nevi of right upper limb, including shoulder: Secondary | ICD-10-CM | POA: Diagnosis not present

## 2018-11-20 DIAGNOSIS — D225 Melanocytic nevi of trunk: Secondary | ICD-10-CM | POA: Diagnosis not present

## 2018-11-20 DIAGNOSIS — L738 Other specified follicular disorders: Secondary | ICD-10-CM | POA: Diagnosis not present

## 2018-12-09 DIAGNOSIS — I10 Essential (primary) hypertension: Secondary | ICD-10-CM | POA: Diagnosis not present

## 2018-12-09 DIAGNOSIS — Z0000 Encounter for general adult medical examination without abnormal findings: Secondary | ICD-10-CM | POA: Diagnosis not present

## 2019-01-25 DIAGNOSIS — K589 Irritable bowel syndrome without diarrhea: Secondary | ICD-10-CM | POA: Diagnosis not present

## 2019-01-25 DIAGNOSIS — R197 Diarrhea, unspecified: Secondary | ICD-10-CM | POA: Diagnosis not present

## 2019-03-24 ENCOUNTER — Other Ambulatory Visit: Payer: Self-pay

## 2019-03-24 ENCOUNTER — Telehealth: Payer: Self-pay | Admitting: *Deleted

## 2019-03-24 ENCOUNTER — Telehealth: Payer: Self-pay | Admitting: Family Medicine

## 2019-03-24 DIAGNOSIS — Z20822 Contact with and (suspected) exposure to covid-19: Secondary | ICD-10-CM

## 2019-03-24 NOTE — Telephone Encounter (Signed)
Pt. Returned call; scheduled for COVID testing at [place] site at 12:00 PM today.  Advised to wear a mask and remain in car for testing.  Verb. Understanding.

## 2019-03-24 NOTE — Telephone Encounter (Signed)
Patient did not have insurance card or information with her today at testing site.

## 2019-03-24 NOTE — Telephone Encounter (Signed)
Patient is pregnant and husband just tested covid19 positive. Referred by [facility] O[facility] Dr. Sherren Mocha Mysinger [phone] Left VM to return call for testing appointment.

## 2019-03-27 LAB — NOVEL CORONAVIRUS, NAA: SARS-CoV-2, NAA: NOT DETECTED

## 2019-04-07 DIAGNOSIS — Z3201 Encounter for pregnancy test, result positive: Secondary | ICD-10-CM | POA: Diagnosis not present

## 2019-04-07 DIAGNOSIS — N911 Secondary amenorrhea: Secondary | ICD-10-CM | POA: Diagnosis not present

## 2019-04-29 DIAGNOSIS — Z3A09 9 weeks gestation of pregnancy: Secondary | ICD-10-CM | POA: Diagnosis not present

## 2019-04-29 DIAGNOSIS — O26891 Other specified pregnancy related conditions, first trimester: Secondary | ICD-10-CM | POA: Diagnosis not present

## 2019-04-29 DIAGNOSIS — O139 Gestational [pregnancy-induced] hypertension without significant proteinuria, unspecified trimester: Secondary | ICD-10-CM | POA: Diagnosis not present

## 2019-04-29 DIAGNOSIS — Z3689 Encounter for other specified antenatal screening: Secondary | ICD-10-CM | POA: Diagnosis not present

## 2019-04-29 DIAGNOSIS — Z363 Encounter for antenatal screening for malformations: Secondary | ICD-10-CM | POA: Diagnosis not present

## 2019-04-29 DIAGNOSIS — Z113 Encounter for screening for infections with a predominantly sexual mode of transmission: Secondary | ICD-10-CM | POA: Diagnosis not present

## 2019-04-29 LAB — OB RESULTS CONSOLE GC/CHLAMYDIA
Chlamydia: NEGATIVE
Gonorrhea: NEGATIVE

## 2019-04-29 LAB — OB RESULTS CONSOLE RPR: RPR: NONREACTIVE

## 2019-04-29 LAB — OB RESULTS CONSOLE HEPATITIS B SURFACE ANTIGEN: Hepatitis B Surface Ag: NEGATIVE

## 2019-04-29 LAB — OB RESULTS CONSOLE ANTIBODY SCREEN: Antibody Screen: NEGATIVE

## 2019-04-29 LAB — OB RESULTS CONSOLE RUBELLA ANTIBODY, IGM: Rubella: IMMUNE

## 2019-04-29 LAB — OB RESULTS CONSOLE HIV ANTIBODY (ROUTINE TESTING): HIV: NONREACTIVE

## 2019-04-29 LAB — OB RESULTS CONSOLE ABO/RH: RH Type: POSITIVE

## 2019-05-21 DIAGNOSIS — Z3682 Encounter for antenatal screening for nuchal translucency: Secondary | ICD-10-CM | POA: Diagnosis not present

## 2019-05-21 DIAGNOSIS — Z3A12 12 weeks gestation of pregnancy: Secondary | ICD-10-CM | POA: Diagnosis not present

## 2019-06-08 DIAGNOSIS — N898 Other specified noninflammatory disorders of vagina: Secondary | ICD-10-CM | POA: Diagnosis not present

## 2019-07-06 DIAGNOSIS — Z363 Encounter for antenatal screening for malformations: Secondary | ICD-10-CM | POA: Diagnosis not present

## 2019-07-06 DIAGNOSIS — Z23 Encounter for immunization: Secondary | ICD-10-CM | POA: Diagnosis not present

## 2019-07-06 DIAGNOSIS — Z3A19 19 weeks gestation of pregnancy: Secondary | ICD-10-CM | POA: Diagnosis not present

## 2019-07-19 DIAGNOSIS — O139 Gestational [pregnancy-induced] hypertension without significant proteinuria, unspecified trimester: Secondary | ICD-10-CM | POA: Diagnosis not present

## 2019-08-18 DIAGNOSIS — Z87442 Personal history of urinary calculi: Secondary | ICD-10-CM | POA: Diagnosis not present

## 2019-08-18 DIAGNOSIS — Z3A25 25 weeks gestation of pregnancy: Secondary | ICD-10-CM | POA: Diagnosis not present

## 2019-08-23 ENCOUNTER — Encounter (INDEPENDENT_AMBULATORY_CARE_PROVIDER_SITE_OTHER): Payer: Self-pay | Admitting: Family Practice

## 2019-08-23 ENCOUNTER — Ambulatory Visit (INDEPENDENT_AMBULATORY_CARE_PROVIDER_SITE_OTHER)

## 2019-08-23 ENCOUNTER — Telehealth (INDEPENDENT_AMBULATORY_CARE_PROVIDER_SITE_OTHER): Admitting: Family Practice

## 2019-08-23 NOTE — Interdisciplinary (Signed)
Patient came by for COVID swab performed by Tamakia Porto

## 2019-08-23 NOTE — Progress Notes (Signed)
[facility] Clinic Video Visit  HPI   Katrina Lam is a 33 year old female , new patient to our practice, is scheduled for pocketdoc App video visit for COVID testing.  Patient presents to clinic via Pocket doc due to COVID-19 pandemic and federally declared state of public health emergency. Patient's identity verified and consent obtained.  Patient states has no significant PMHx of concern. She's asymptomatic, and has no known exposure to known COVID patient. No symptoms of URI/flu like symptoms. She has had a minor HEADACHE, but normal to he; nothing out of ordinary. She's concerned for coming travel in 5 days, and would like to tested for COVID.   NO fever/chills/body aches/sore throat/cough reported.     REVIEW OF SYSTEMS    Negative except for those in HPI      PAST MEDICAL AND SURGICAL HISTORY:    has no past surgical history on file.  History reviewed. No pertinent past medical history.     CURRENT MEDICATIONS:  No current outpatient medications on file.     ALLERGIES:  Patient has no known allergies.    FAMILY HISTORY:  family history is not on file.    SOCIAL HISTORY:         PHYSICAL EXAMINATION    There were no vitals filed for this visit.  There is no height or weight on file to calculate BMI.  Wt Readings from Last 5 Encounters:   No data found for Wt     Blood Pressure   No data found for BP       Physical Exam  Gen:      Comfortable, in no apparent distress  HENT:      Head: Normocephalic and atraumatic.   Eyes:      Conjunctiva/sclera: Conjunctivae normal.   Pulmonary:      Effort: Pulmonary effort is normal. No respiratory distress.   Neurological:      General: No focal deficit present.      Mental Status: She is alert.   Psychiatric:         Mood and Affect: Mood normal.        ASSESSMENT AND PLAN    1. Encounter for laboratory testing for COVID-19 virus  Testing for COVID pre travel. Informed of process, timeline for result. Will setup Mychart to view result.   - SARS-CoV-2, NAA - [facility]; Future  -  SARS-CoV-2, NAA - [facility]        FOLLOW UP   Return if symptoms worsen or fail to improve.    Karen Chafe, MD

## 2019-08-25 ENCOUNTER — Other Ambulatory Visit: Payer: Self-pay

## 2019-08-25 DIAGNOSIS — Z20822 Contact with and (suspected) exposure to covid-19: Secondary | ICD-10-CM

## 2019-08-25 LAB — SARS-COV-2, NAA - LABCORP: COVID-19 Coronavirus PCR - LabCorp: NOT DETECTED

## 2019-08-26 LAB — NOVEL CORONAVIRUS, NAA: SARS-CoV-2, NAA: NOT DETECTED

## 2019-09-09 DIAGNOSIS — Z3689 Encounter for other specified antenatal screening: Secondary | ICD-10-CM | POA: Diagnosis not present

## 2019-09-09 DIAGNOSIS — Z23 Encounter for immunization: Secondary | ICD-10-CM | POA: Diagnosis not present

## 2019-09-09 DIAGNOSIS — R8279 Other abnormal findings on microbiological examination of urine: Secondary | ICD-10-CM | POA: Diagnosis not present

## 2019-09-09 DIAGNOSIS — Z3A28 28 weeks gestation of pregnancy: Secondary | ICD-10-CM | POA: Diagnosis not present

## 2019-09-20 DIAGNOSIS — O139 Gestational [pregnancy-induced] hypertension without significant proteinuria, unspecified trimester: Secondary | ICD-10-CM | POA: Diagnosis not present

## 2019-09-20 DIAGNOSIS — O1012 Pre-existing hypertensive heart disease complicating childbirth: Secondary | ICD-10-CM | POA: Diagnosis not present

## 2019-09-20 DIAGNOSIS — Z3A30 30 weeks gestation of pregnancy: Secondary | ICD-10-CM | POA: Diagnosis not present

## 2019-09-20 DIAGNOSIS — O133 Gestational [pregnancy-induced] hypertension without significant proteinuria, third trimester: Secondary | ICD-10-CM | POA: Diagnosis not present

## 2019-10-08 NOTE — L&D Delivery Note (Signed)
Delivery Note Pt reached complete dilation and baby noted to have repetitive variable decelerations, so began pushing.  She pushed well about 15 minutes and aAt 2:57 PM a viable female was delivered via Vaginal, Spontaneous (Presentation: Left Occiput Anterior).  APGAR: 9, 9; weight pending .   Placenta status: Spontaneous, Intact.  Cord: 3 vessels with the following complications: None.  Anesthesia: Epidural Episiotomy: None Lacerations: 2nd degree;Perineal Suture Repair: 3.0 vicryl Est. Blood Loss (mL): 100  Mom to postpartum.  Baby to Couplet care / Skin to Skin. Pt had a few severe range blood pressures just prior to delivery and given IV labetalol.  Will continue her po labetalol 200mg  po BID and follow.  PIH labs WNL this AM.   D/w pt and husband circumcision and they desire to proceed in hospital when baby able. 11/13/2019, 3:22 PM

## 2019-10-11 DIAGNOSIS — O10013 Pre-existing essential hypertension complicating pregnancy, third trimester: Secondary | ICD-10-CM | POA: Diagnosis not present

## 2019-10-11 DIAGNOSIS — Z3A33 33 weeks gestation of pregnancy: Secondary | ICD-10-CM | POA: Diagnosis not present

## 2019-10-14 DIAGNOSIS — O365931 Maternal care for other known or suspected poor fetal growth, third trimester, fetus 1: Secondary | ICD-10-CM | POA: Diagnosis not present

## 2019-10-14 DIAGNOSIS — O133 Gestational [pregnancy-induced] hypertension without significant proteinuria, third trimester: Secondary | ICD-10-CM | POA: Diagnosis not present

## 2019-10-14 DIAGNOSIS — Z3A33 33 weeks gestation of pregnancy: Secondary | ICD-10-CM | POA: Diagnosis not present

## 2019-10-18 DIAGNOSIS — Z3A34 34 weeks gestation of pregnancy: Secondary | ICD-10-CM | POA: Diagnosis not present

## 2019-10-18 DIAGNOSIS — I10 Essential (primary) hypertension: Secondary | ICD-10-CM | POA: Diagnosis not present

## 2019-10-21 DIAGNOSIS — Z3A34 34 weeks gestation of pregnancy: Secondary | ICD-10-CM | POA: Diagnosis not present

## 2019-10-21 DIAGNOSIS — O139 Gestational [pregnancy-induced] hypertension without significant proteinuria, unspecified trimester: Secondary | ICD-10-CM | POA: Diagnosis not present

## 2019-10-21 DIAGNOSIS — O10913 Unspecified pre-existing hypertension complicating pregnancy, third trimester: Secondary | ICD-10-CM | POA: Diagnosis not present

## 2019-10-25 DIAGNOSIS — Z3A35 35 weeks gestation of pregnancy: Secondary | ICD-10-CM | POA: Diagnosis not present

## 2019-10-25 DIAGNOSIS — O133 Gestational [pregnancy-induced] hypertension without significant proteinuria, third trimester: Secondary | ICD-10-CM | POA: Diagnosis not present

## 2019-10-27 DIAGNOSIS — Z3A25 25 weeks gestation of pregnancy: Secondary | ICD-10-CM | POA: Diagnosis not present

## 2019-10-27 DIAGNOSIS — Z8759 Personal history of other complications of pregnancy, childbirth and the puerperium: Secondary | ICD-10-CM | POA: Diagnosis not present

## 2019-11-01 DIAGNOSIS — Z3685 Encounter for antenatal screening for Streptococcus B: Secondary | ICD-10-CM | POA: Diagnosis not present

## 2019-11-01 DIAGNOSIS — Z3A36 36 weeks gestation of pregnancy: Secondary | ICD-10-CM | POA: Diagnosis not present

## 2019-11-01 DIAGNOSIS — O10919 Unspecified pre-existing hypertension complicating pregnancy, unspecified trimester: Secondary | ICD-10-CM | POA: Diagnosis not present

## 2019-11-01 LAB — OB RESULTS CONSOLE GBS: GBS: NEGATIVE

## 2019-11-04 DIAGNOSIS — Z3A36 36 weeks gestation of pregnancy: Secondary | ICD-10-CM | POA: Diagnosis not present

## 2019-11-04 DIAGNOSIS — O10013 Pre-existing essential hypertension complicating pregnancy, third trimester: Secondary | ICD-10-CM | POA: Diagnosis not present

## 2019-11-08 DIAGNOSIS — I10 Essential (primary) hypertension: Secondary | ICD-10-CM | POA: Diagnosis not present

## 2019-11-08 DIAGNOSIS — Z3A37 37 weeks gestation of pregnancy: Secondary | ICD-10-CM | POA: Diagnosis not present

## 2019-11-09 ENCOUNTER — Telehealth (HOSPITAL_COMMUNITY): Payer: Self-pay | Admitting: *Deleted

## 2019-11-09 ENCOUNTER — Encounter (HOSPITAL_COMMUNITY): Payer: Self-pay | Admitting: *Deleted

## 2019-11-09 NOTE — Telephone Encounter (Signed)
Preadmission screen  

## 2019-11-11 DIAGNOSIS — O133 Gestational [pregnancy-induced] hypertension without significant proteinuria, third trimester: Secondary | ICD-10-CM | POA: Diagnosis not present

## 2019-11-11 DIAGNOSIS — O365931 Maternal care for other known or suspected poor fetal growth, third trimester, fetus 1: Secondary | ICD-10-CM | POA: Diagnosis not present

## 2019-11-11 DIAGNOSIS — O139 Gestational [pregnancy-induced] hypertension without significant proteinuria, unspecified trimester: Secondary | ICD-10-CM | POA: Diagnosis not present

## 2019-11-11 DIAGNOSIS — Z3A37 37 weeks gestation of pregnancy: Secondary | ICD-10-CM | POA: Diagnosis not present

## 2019-11-12 ENCOUNTER — Other Ambulatory Visit (HOSPITAL_COMMUNITY)
Admission: RE | Admit: 2019-11-12 | Discharge: 2019-11-12 | Disposition: A | Discharge status: Home / Self Care | Payer: BC Managed Care – PPO | Source: Ambulatory Visit | Attending: Obstetrics and Gynecology | Admitting: Obstetrics and Gynecology

## 2019-11-12 ENCOUNTER — Other Ambulatory Visit: Payer: Self-pay | Admitting: Obstetrics and Gynecology

## 2019-11-12 DIAGNOSIS — Z23 Encounter for immunization: Secondary | ICD-10-CM | POA: Diagnosis not present

## 2019-11-12 DIAGNOSIS — Z01812 Encounter for preprocedural laboratory examination: Secondary | ICD-10-CM | POA: Insufficient documentation

## 2019-11-12 DIAGNOSIS — Z20822 Contact with and (suspected) exposure to covid-19: Secondary | ICD-10-CM | POA: Diagnosis not present

## 2019-11-12 DIAGNOSIS — O99344 Other mental disorders complicating childbirth: Secondary | ICD-10-CM | POA: Diagnosis not present

## 2019-11-12 DIAGNOSIS — O34211 Maternal care for low transverse scar from previous cesarean delivery: Secondary | ICD-10-CM | POA: Diagnosis not present

## 2019-11-12 DIAGNOSIS — Z3A00 Weeks of gestation of pregnancy not specified: Secondary | ICD-10-CM | POA: Diagnosis not present

## 2019-11-12 DIAGNOSIS — F419 Anxiety disorder, unspecified: Secondary | ICD-10-CM | POA: Diagnosis not present

## 2019-11-12 DIAGNOSIS — O411290 Chorioamnionitis, unspecified trimester, not applicable or unspecified: Secondary | ICD-10-CM | POA: Diagnosis not present

## 2019-11-12 DIAGNOSIS — O34219 Maternal care for unspecified type scar from previous cesarean delivery: Secondary | ICD-10-CM | POA: Diagnosis not present

## 2019-11-12 DIAGNOSIS — O164 Unspecified maternal hypertension, complicating childbirth: Secondary | ICD-10-CM | POA: Diagnosis not present

## 2019-11-12 DIAGNOSIS — O365990 Maternal care for other known or suspected poor fetal growth, unspecified trimester, not applicable or unspecified: Secondary | ICD-10-CM | POA: Diagnosis not present

## 2019-11-12 DIAGNOSIS — Z3A37 37 weeks gestation of pregnancy: Secondary | ICD-10-CM | POA: Diagnosis not present

## 2019-11-12 DIAGNOSIS — O1002 Pre-existing essential hypertension complicating childbirth: Secondary | ICD-10-CM | POA: Diagnosis not present

## 2019-11-12 DIAGNOSIS — O365930 Maternal care for other known or suspected poor fetal growth, third trimester, not applicable or unspecified: Secondary | ICD-10-CM | POA: Diagnosis not present

## 2019-11-12 LAB — SARS CORONAVIRUS 2 (TAT 6-24 HRS): SARS Coronavirus 2: NEGATIVE

## 2019-11-12 NOTE — H&P (Signed)
Sherry Cobb is a 34 y.o. female [id number] at 32 6/7 weeks (EDD 11/28/19 by LMP c/w 8 week [place]) presenting for IOL for increasing blood pressures with a history of CHTN.  She was on atenolol prior to pregnancy and able to come off first trimester then needed restarted on labetalol at 12 weeks and baby ASA.   She has required steadily increasing doses and now on 200mg  q AM 100mg  q lunch and 200,g q PM. on 11/11/19 showed an EFW 10%ile, vtx, AFI 6 BPP 8/8 Normal dopplers.   Prenatal care also significant for anxiety treated with zoloft in past, she has been stable off med for the pregnancy.  She had a LTCS with G1 for severe preeclampsia at 33 weeks, then a successful VBAC last pregnancy.    OB History    Gravida  3   Para  2   Term  1   Preterm  1   AB      Living  2     SAB      TAB      Ectopic      Multiple  0   Live Births  2         10-01-2013, 33.5 wks  F, 3lbs 14oz  LTCS  03-27-2017, 40 wks 1. F, 6lbs 8oz, VBAC  Past Medical History:  Diagnosis Date  . Kidney stones   . Preeclampsia 2014  . Pregnancy induced hypertension   . Preterm labor    Past Surgical History:  Procedure Laterality Date  . CESAREAN SECTION N/A 10/01/2013   Procedure: Primary Cesarean Section Delivery Baby Girl @ [phone], Apgars 5/7;  Surgeon: [id number], MD;  Location: [facility];  Service: Obstetrics;  Laterality: N/A;  . NOSE SURGERY    . WISDOM TOOTH EXTRACTION     Family History: family history includes Cancer in her father; Diabetes in her father; Heart disease in her maternal grandfather, maternal grandmother, paternal grandfather, and paternal grandmother; Hypertension in her father and mother; Kidney disease in her father. Social History:  reports that she has never smoked. She has never used smokeless tobacco. She reports current alcohol use. She reports that she does not use drugs.     Maternal Diabetes: No Genetic Screening: Normal Maternal Ultrasounds/Referrals: Normal Fetal  Ultrasounds or other Referrals:  None Maternal Substance Abuse:  No Significant Maternal Medications:  Meds include: Other:  Significant Maternal Lab Results:  None Other Comments:  None  Review of Systems  Constitutional: Negative for fever.  Gastrointestinal: Negative for abdominal pain.  Neurological: Negative for headaches.   Maternal Medical History:  Contractions: Frequency: irregular.   Perceived severity is mild.    Fetal activity: Perceived fetal activity is normal.    Prenatal complications: PIH and IUGR.   Prenatal Complications - Diabetes: none.      Last menstrual period 02/21/2019, unknown if currently breastfeeding. Maternal Exam:  Uterine Assessment: Contraction strength is mild.  Contraction frequency is regular.   Abdomen: Surgical scars: low transverse.   Fetal presentation: vertex  Introitus: Normal vulva. Normal vagina.    Physical Exam  Constitutional: She is oriented to person, place, and time. She appears well-developed.  Cardiovascular: Normal rate and regular rhythm.  Respiratory: Effort normal.  Genitourinary:    Vulva, vagina and uterus normal.   Musculoskeletal:        General: No edema.  Neurological: She is alert and oriented to person, place, and time.  Psychiatric: She has a normal mood  and affect.    Prenatal labs: ABO, Rh: A/Positive/-- (07/23 0000) Antibody: Negative (07/23 0000) Rubella: Immune (07/23 0000) RPR: Nonreactive (07/23 0000)  HBsAg: Negative (07/23 0000)  HIV: Non-reactive (07/23 0000)  GBS:   Negative One hour GCT 90 First trimester screen negative Essential panel negative last pregnancy  Assessment/Plan:  d/w pt with increasing BP's and EFW at 10%ile, I feel we should proceed with delivery.  She is favorable so will plan AROM and pitocin with epidural prn.  She has not had preeclampsia symptoms, but will check labs on admission. Continue labetalol for blood pressure control.  Logan Bores 11/12/2019,  5:54 PM

## 2019-11-12 NOTE — H&P (Deleted)
  The note originally documented on this encounter has been moved the the encounter in which it belongs.  

## 2019-11-13 ENCOUNTER — Inpatient Hospital Stay (HOSPITAL_COMMUNITY): Payer: BC Managed Care – PPO | Admitting: Anesthesiology

## 2019-11-13 ENCOUNTER — Other Ambulatory Visit: Payer: Self-pay

## 2019-11-13 ENCOUNTER — Inpatient Hospital Stay (HOSPITAL_COMMUNITY): Payer: BC Managed Care – PPO

## 2019-11-13 ENCOUNTER — Inpatient Hospital Stay (HOSPITAL_COMMUNITY)
Admission: AD | Admit: 2019-11-13 | Payer: BC Managed Care – PPO | Source: Home / Self Care | Admitting: Obstetrics and Gynecology

## 2019-11-13 ENCOUNTER — Inpatient Hospital Stay (HOSPITAL_COMMUNITY)
Admission: AD | Admit: 2019-11-13 | Discharge: 2019-11-14 | DRG: 807 | Disposition: A | Discharge status: Home / Self Care | Payer: BC Managed Care – PPO | Attending: Obstetrics and Gynecology | Admitting: Obstetrics and Gynecology

## 2019-11-13 ENCOUNTER — Encounter (HOSPITAL_COMMUNITY): Payer: Self-pay | Admitting: Obstetrics and Gynecology

## 2019-11-13 DIAGNOSIS — Z20822 Contact with and (suspected) exposure to covid-19: Secondary | ICD-10-CM | POA: Diagnosis present

## 2019-11-13 DIAGNOSIS — O1002 Pre-existing essential hypertension complicating childbirth: Principal | ICD-10-CM | POA: Diagnosis present

## 2019-11-13 DIAGNOSIS — Z3A37 37 weeks gestation of pregnancy: Secondary | ICD-10-CM

## 2019-11-13 DIAGNOSIS — Z23 Encounter for immunization: Secondary | ICD-10-CM | POA: Diagnosis not present

## 2019-11-13 DIAGNOSIS — O34211 Maternal care for low transverse scar from previous cesarean delivery: Secondary | ICD-10-CM | POA: Diagnosis present

## 2019-11-13 DIAGNOSIS — Z3A00 Weeks of gestation of pregnancy not specified: Secondary | ICD-10-CM | POA: Diagnosis not present

## 2019-11-13 DIAGNOSIS — F419 Anxiety disorder, unspecified: Secondary | ICD-10-CM | POA: Diagnosis present

## 2019-11-13 DIAGNOSIS — O164 Unspecified maternal hypertension, complicating childbirth: Secondary | ICD-10-CM | POA: Diagnosis not present

## 2019-11-13 DIAGNOSIS — O10913 Unspecified pre-existing hypertension complicating pregnancy, third trimester: Secondary | ICD-10-CM | POA: Diagnosis present

## 2019-11-13 DIAGNOSIS — O34219 Maternal care for unspecified type scar from previous cesarean delivery: Secondary | ICD-10-CM | POA: Diagnosis not present

## 2019-11-13 DIAGNOSIS — O365930 Maternal care for other known or suspected poor fetal growth, third trimester, not applicable or unspecified: Secondary | ICD-10-CM | POA: Diagnosis present

## 2019-11-13 DIAGNOSIS — O411290 Chorioamnionitis, unspecified trimester, not applicable or unspecified: Secondary | ICD-10-CM | POA: Diagnosis not present

## 2019-11-13 DIAGNOSIS — O99344 Other mental disorders complicating childbirth: Secondary | ICD-10-CM | POA: Diagnosis present

## 2019-11-13 DIAGNOSIS — O365990 Maternal care for other known or suspected poor fetal growth, unspecified trimester, not applicable or unspecified: Secondary | ICD-10-CM | POA: Diagnosis not present

## 2019-11-13 LAB — PROTEIN / CREATININE RATIO, URINE
Creatinine, Urine: 68.37 mg/dL
Protein Creatinine Ratio: 0.1 mg/mg{Cre} (ref 0.00–0.15)
Total Protein, Urine: 7 mg/dL

## 2019-11-13 LAB — CBC
HCT: 33.7 % — ABNORMAL LOW (ref 36.0–46.0)
Hemoglobin: 10.8 g/dL — ABNORMAL LOW (ref 12.0–15.0)
MCH: 28.6 pg (ref 26.0–34.0)
MCHC: 32 g/dL (ref 30.0–36.0)
MCV: 89.4 fL (ref 80.0–100.0)
Platelets: 203 10*3/uL (ref 150–400)
RBC: 3.77 MIL/uL — ABNORMAL LOW (ref 3.87–5.11)
RDW: 15 % (ref 11.5–15.5)
WBC: 11.9 10*3/uL — ABNORMAL HIGH (ref 4.0–10.5)
nRBC: 0 % (ref 0.0–0.2)

## 2019-11-13 LAB — COMPREHENSIVE METABOLIC PANEL
ALT: 13 U/L (ref 0–44)
AST: 20 U/L (ref 15–41)
Albumin: 2.9 g/dL — ABNORMAL LOW (ref 3.5–5.0)
Alkaline Phosphatase: 158 U/L — ABNORMAL HIGH (ref 38–126)
Anion gap: 11 (ref 5–15)
BUN: 10 mg/dL (ref 6–20)
CO2: 19 mmol/L — ABNORMAL LOW (ref 22–32)
Calcium: 9.3 mg/dL (ref 8.9–10.3)
Chloride: 105 mmol/L (ref 98–111)
Creatinine, Ser: 0.73 mg/dL (ref 0.44–1.00)
GFR calc Af Amer: >60 mL/min (ref >60)
GFR calc non Af Amer: >60 mL/min (ref >60)
Glucose, Bld: 85 mg/dL (ref 70–99)
Potassium: 4 mmol/L (ref 3.5–5.1)
Sodium: 135 mmol/L (ref 135–145)
Total Bilirubin: 0.5 mg/dL (ref 0.3–1.2)
Total Protein: 6.3 g/dL — ABNORMAL LOW (ref 6.5–8.1)

## 2019-11-13 LAB — ABO/RH: ABO/RH(D): A POS

## 2019-11-13 LAB — TYPE AND SCREEN
ABO/RH(D): A POS
Antibody Screen: NEGATIVE

## 2019-11-13 LAB — RPR: RPR Ser Ql: NONREACTIVE

## 2019-11-13 MED ORDER — WITCH HAZEL-GLYCERIN EX PADS: 1.0000 "application " | MEDICATED_PAD | CUTANEOUS | Status: DC | PRN

## 2019-11-13 MED ORDER — LABETALOL HCL 5 MG/ML IV SOLN: 40.0000 mg | INTRAVENOUS | Status: DC | PRN

## 2019-11-13 MED ORDER — IBUPROFEN 600 MG PO TABS
600.0000 mg | ORAL_TABLET | Freq: Four times a day (QID) | ORAL | Status: DC
  Administered 2019-11-13 – 2019-11-14 (×4): 600 mg via ORAL
  Filled 2019-11-13 (×5): qty 1

## 2019-11-13 MED ORDER — ONDANSETRON HCL 4 MG PO TABS: 4.0000 mg | ORAL_TABLET | ORAL | Status: DC | PRN

## 2019-11-13 MED ORDER — OXYTOCIN BOLUS FROM INFUSION
500.0000 mL | Freq: Once | INTRAVENOUS | Status: AC
  Administered 2019-11-13: 15:00:00 500 mL via INTRAVENOUS

## 2019-11-13 MED ORDER — TETANUS-DIPHTH-ACELL PERTUSSIS 5-2.5-18.5 LF-MCG/0.5 IM SUSP: 0.5000 mL | Freq: Once | INTRAMUSCULAR | Status: DC

## 2019-11-13 MED ORDER — EPHEDRINE 5 MG/ML INJ: 10.0000 mg | INTRAVENOUS | Status: DC | PRN

## 2019-11-13 MED ORDER — PHENYLEPHRINE 40 MCG/ML (10ML) SYRINGE FOR IV PUSH (FOR BLOOD PRESSURE SUPPORT): 80.0000 ug | PREFILLED_SYRINGE | INTRAVENOUS | Status: DC | PRN

## 2019-11-13 MED ORDER — ACETAMINOPHEN 325 MG PO TABS
650.0000 mg | ORAL_TABLET | ORAL | Status: DC | PRN
  Administered 2019-11-14: 650 mg via ORAL
  Filled 2019-11-13: qty 2

## 2019-11-13 MED ORDER — ZOLPIDEM TARTRATE 5 MG PO TABS: 5.0000 mg | ORAL_TABLET | Freq: Every evening | ORAL | Status: DC | PRN

## 2019-11-13 MED ORDER — DIBUCAINE (PERIANAL) 1 % EX OINT: 1.0000 "application " | TOPICAL_OINTMENT | CUTANEOUS | Status: DC | PRN

## 2019-11-13 MED ORDER — SIMETHICONE 80 MG PO CHEW: 80.0000 mg | CHEWABLE_TABLET | ORAL | Status: DC | PRN

## 2019-11-13 MED ORDER — COCONUT OIL OIL: 1.0000 "application " | TOPICAL_OIL | Status: DC | PRN

## 2019-11-13 MED ORDER — DIPHENHYDRAMINE HCL 25 MG PO CAPS: 25.0000 mg | ORAL_CAPSULE | Freq: Four times a day (QID) | ORAL | Status: DC | PRN

## 2019-11-13 MED ORDER — BENZOCAINE-MENTHOL 20-0.5 % EX AERO
1.0000 "application " | INHALATION_SPRAY | CUTANEOUS | Status: DC | PRN
  Administered 2019-11-13: 1 via TOPICAL
  Filled 2019-11-13: qty 56

## 2019-11-13 MED ORDER — SOD CITRATE-CITRIC ACID 500-334 MG/5ML PO SOLN: 30.0000 mL | ORAL | Status: DC | PRN

## 2019-11-13 MED ORDER — LIDOCAINE HCL (PF) 1 % IJ SOLN: 30.0000 mL | INTRAMUSCULAR | Status: DC | PRN

## 2019-11-13 MED ORDER — LACTATED RINGERS IV SOLN
INTRAVENOUS | Status: DC
  Administered 2019-11-13 (×2): 125 mL/h via INTRAVENOUS

## 2019-11-13 MED ORDER — BUTORPHANOL TARTRATE 1 MG/ML IJ SOLN: 1.0000 mg | INTRAMUSCULAR | Status: DC | PRN

## 2019-11-13 MED ORDER — SENNOSIDES-DOCUSATE SODIUM 8.6-50 MG PO TABS
2.0000 | ORAL_TABLET | ORAL | Status: DC
  Administered 2019-11-13: 23:00:00 2 via ORAL
  Filled 2019-11-13: qty 2

## 2019-11-13 MED ORDER — DIPHENHYDRAMINE HCL 50 MG/ML IJ SOLN: 12.5000 mg | INTRAMUSCULAR | Status: DC | PRN

## 2019-11-13 MED ORDER — ONDANSETRON HCL 4 MG/2ML IJ SOLN: 4.0000 mg | Freq: Four times a day (QID) | INTRAMUSCULAR | Status: DC | PRN

## 2019-11-13 MED ORDER — ONDANSETRON HCL 4 MG/2ML IJ SOLN: 4.0000 mg | INTRAMUSCULAR | Status: DC | PRN

## 2019-11-13 MED ORDER — FENTANYL-BUPIVACAINE-NACL 0.5-0.125-0.9 MG/250ML-% EP SOLN
12.0000 mL/h | EPIDURAL | Status: DC | PRN
  Filled 2019-11-13: qty 250

## 2019-11-13 MED ORDER — PHENYLEPHRINE 40 MCG/ML (10ML) SYRINGE FOR IV PUSH (FOR BLOOD PRESSURE SUPPORT)
80.0000 ug | PREFILLED_SYRINGE | INTRAVENOUS | Status: DC | PRN
  Filled 2019-11-13: qty 10

## 2019-11-13 MED ORDER — MAGNESIUM HYDROXIDE 400 MG/5ML PO SUSP
15.0000 mL | Freq: Every evening | ORAL | Status: DC | PRN
  Administered 2019-11-14: 15 mL via ORAL
  Filled 2019-11-13: qty 30

## 2019-11-13 MED ORDER — TERBUTALINE SULFATE 1 MG/ML IJ SOLN: 0.2500 mg | Freq: Once | INTRAMUSCULAR | Status: DC | PRN

## 2019-11-13 MED ORDER — LABETALOL HCL 5 MG/ML IV SOLN
20.0000 mg | Freq: Once | INTRAVENOUS | Status: AC
  Administered 2019-11-13: 15:00:00 20 mg via INTRAVENOUS

## 2019-11-13 MED ORDER — SODIUM CHLORIDE (PF) 0.9 % IJ SOLN
INTRAMUSCULAR | Status: DC | PRN
  Administered 2019-11-13: 12 mL/h via EPIDURAL

## 2019-11-13 MED ORDER — LACTATED RINGERS IV SOLN: 500.0000 mL | Freq: Once | INTRAVENOUS | Status: DC

## 2019-11-13 MED ORDER — OXYTOCIN 40 UNITS IN NORMAL SALINE INFUSION - SIMPLE MED
1.0000 m[IU]/min | INTRAVENOUS | Status: DC
  Administered 2019-11-13: 2 m[IU]/min via INTRAVENOUS
  Filled 2019-11-13: qty 1000

## 2019-11-13 MED ORDER — OXYCODONE-ACETAMINOPHEN 5-325 MG PO TABS: 1.0000 | ORAL_TABLET | ORAL | Status: DC | PRN

## 2019-11-13 MED ORDER — LACTATED RINGERS IV SOLN
500.0000 mL | INTRAVENOUS | Status: DC | PRN
  Administered 2019-11-13: 10:00:00 500 mL via INTRAVENOUS

## 2019-11-13 MED ORDER — PRENATAL MULTIVITAMIN CH
1.0000 | ORAL_TABLET | Freq: Every day | ORAL | Status: DC
  Administered 2019-11-14: 12:00:00 1 via ORAL
  Filled 2019-11-13: qty 1

## 2019-11-13 MED ORDER — LABETALOL HCL 5 MG/ML IV SOLN: 20.0000 mg | INTRAVENOUS | Status: DC | PRN

## 2019-11-13 MED ORDER — LABETALOL HCL 5 MG/ML IV SOLN: 80.0000 mg | INTRAVENOUS | Status: DC | PRN

## 2019-11-13 MED ORDER — OXYTOCIN 40 UNITS IN NORMAL SALINE INFUSION - SIMPLE MED: 2.5000 [IU]/h | INTRAVENOUS | Status: DC

## 2019-11-13 MED ORDER — LABETALOL HCL 5 MG/ML IV SOLN
INTRAVENOUS | Status: AC
  Filled 2019-11-13: qty 4

## 2019-11-13 MED ORDER — ACETAMINOPHEN 325 MG PO TABS: 650.0000 mg | ORAL_TABLET | ORAL | Status: DC | PRN

## 2019-11-13 MED ORDER — LABETALOL HCL 200 MG PO TABS
200.0000 mg | ORAL_TABLET | Freq: Two times a day (BID) | ORAL | Status: DC
  Administered 2019-11-13 – 2019-11-14 (×3): 200 mg via ORAL
  Filled 2019-11-13 (×3): qty 1

## 2019-11-13 MED ORDER — OXYCODONE-ACETAMINOPHEN 5-325 MG PO TABS: 2.0000 | ORAL_TABLET | ORAL | Status: DC | PRN

## 2019-11-13 MED ORDER — HYDRALAZINE HCL 10 MG PO TABS
10.0000 mg | ORAL_TABLET | Freq: Four times a day (QID) | ORAL | Status: DC | PRN
  Filled 2019-11-13: qty 1

## 2019-11-13 MED ORDER — LIDOCAINE HCL (PF) 1 % IJ SOLN
INTRAMUSCULAR | Status: DC | PRN
  Administered 2019-11-13: 8 mL via EPIDURAL

## 2019-11-13 NOTE — Anesthesia Postprocedure Evaluation (Signed)
Anesthesia Post Note  Patient: Sherry Cobb  Procedure(s) Performed: AN AD HOC LABOR EPIDURAL     Patient location during evaluation: Mother Baby Anesthesia Type: Epidural Level of consciousness: awake and alert Pain management: pain level controlled Vital Signs Assessment: post-procedure vital signs reviewed and stable Respiratory status: spontaneous breathing, nonlabored ventilation and respiratory function stable Cardiovascular status: stable Postop Assessment: no headache, no backache and epidural receding Anesthetic complications: no    Last Vitals:  Vitals:   11/13/19 1616 11/13/19 1700  BP: (!) 153/86 140/88  Pulse: 83 88  Resp: 16 16  Temp:  37.1 C  SpO2:  99%    Last Pain:  Vitals:   11/13/19 1700  TempSrc: Oral  PainSc: 0-No pain                 Fryda Molenda

## 2019-11-13 NOTE — Anesthesia Preprocedure Evaluation (Signed)
Anesthesia Evaluation  Patient identified by MRN, date of birth, ID band Patient awake    Reviewed: Allergy & Precautions, H&P , NPO status , Patient's Chart, lab work & pertinent test results  Airway Mallampati: I  TM Distance: >3 FB Neck ROM: full    Dental no notable dental hx.    Pulmonary neg pulmonary ROS,    Pulmonary exam normal breath sounds clear to auscultation       Cardiovascular hypertension, Pt. on medications Normal cardiovascular exam     Neuro/Psych negative neurological ROS  negative psych ROS   GI/Hepatic negative GI ROS, Neg liver ROS,   Endo/Other  negative endocrine ROS  Renal/GU      Musculoskeletal   Abdominal Normal abdominal exam  (+)   Peds  Hematology negative hematology ROS (+)   Anesthesia Other Findings   Reproductive/Obstetrics (+) Pregnancy                             Anesthesia Physical  Anesthesia Plan  ASA: III  Anesthesia Plan: Epidural   Post-op Pain Management:    Induction:   PONV Risk Score and Plan:   Airway Management Planned:   Additional Equipment:   Intra-op Plan:   Post-operative Plan:   Informed Consent: I have reviewed the patients History and Physical, chart, labs and discussed the procedure including the risks, benefits and alternatives for the proposed anesthesia with the patient or authorized representative who has indicated his/her understanding and acceptance.       Plan Discussed with: Anesthesiologist  Anesthesia Plan Comments:         Anesthesia Quick Evaluation

## 2019-11-13 NOTE — Progress Notes (Signed)
Patient ID: Sherry Cobb, female   DOB: 06/15/1986, 34 y.o.   MRN: [id number] Pt admitted and feeling minimal contractions  BP 151/95 (did not yet take AM labetalol)  Cervix 50/2+/-2 posterior  AROM scant clear fluid  PIH labs ordered and pending Pt will take her po labetalol Pitocin per protocol Pt has been counseled on risks and benefits of VBAC in detail.

## 2019-11-13 NOTE — Progress Notes (Signed)
Patient ID: Sherry Cobb, female   DOB: [birth year], 34 y.o.   MRN: [id number] Received epidural and comfortable  afeb BP 140-150/85-91  60/3/-2 posterior  FHR category 1 with occasional mild variables IUPC placed to adjust pitocin Follow progess

## 2019-11-13 NOTE — Anesthesia Procedure Notes (Signed)
Epidural Patient location during procedure: OB Start time: 11/13/2019 10:05 AM End time: 11/13/2019 10:10 AM  Staffing Anesthesiologist: Bethena Midget, MD  Preanesthetic Checklist Completed: patient identified, IV checked, site marked, risks and benefits discussed, surgical consent, monitors and equipment checked, pre-op evaluation and timeout performed  Epidural Patient position: sitting Prep: DuraPrep and site prepped and draped Patient monitoring: continuous pulse ox and blood pressure Approach: midline Location: L3-L4 Injection technique: LOR air  Needle:  Needle type: Tuohy  Needle gauge: 17 G Needle length: 9 cm and 9 Needle insertion depth: 5 cm cm Catheter type: closed end flexible Catheter size: 19 Gauge Catheter at skin depth: 10 cm Test dose: negative  Assessment Events: blood not aspirated, injection not painful, no injection resistance, no paresthesia and negative IV test

## 2019-11-13 NOTE — Lactation Note (Signed)
This note was copied from a baby's chart. Lactation Consultation Note  Patient Name: Sherry Cobb [id number] Date: 11/13/2019 Reason for consult: Initial assessment;Infant < 6lbs;Other (Comment) Entered room and Started talking with mom, RN came and said infant had low blood sugar of 29.  Assisted putting infant STS and hand expressed and spoon feed approximately 10 ml of colostrum to infant. Infant not showing any external signs of low blood sugars.  Infant started to get a little spitty and gurgly even thought still cuing so stopped hand expression, and spoon feeding and mom burped him, and put him STS. Mom wanted to do a phone call to her other girls for a while before starting to use DEBP.  Discussed with mom while hand expressing and spoon feeding that she should be pumping and hand expressing and feeding back all expressed mothers milk.  Will come back later or get pm RN to follow up with mom and get her pumping.  Urged mom to call lactation as needed.  Maternal Data Formula Feeding for Exclusion: No Has patient been taught Hand Expression?: Yes Does the patient have breastfeeding experience prior to this delivery?: Yes  Feeding Feeding Type: Breast Milk  LATCH Score                   Interventions Interventions: Skin to skin;Hand express;Expressed milk  Lactation Tools Discussed/Used Pump Review: Setup, frequency, and cleaning;Milk Storage Initiated by:: Olevia Westervelt Date initiated:: 11/13/19   Consult Status Consult Status: Follow-up Date: 11/14/19 Follow-up type: In-patient    Sherry Cobb [name] 11/13/2019, 8:22 PM

## 2019-11-13 NOTE — Progress Notes (Signed)
Patient ID: Sherry Cobb, female   DOB: 12-19-1985, 34 y.o.   MRN: [id number] Comfortable with epidural  afeb  BP 140-150/80-90 FHR category 1 with occasional variable decelerations  70/4-5/-1  Follow progress

## 2019-11-14 LAB — CBC
HCT: 29.9 % — ABNORMAL LOW (ref 36.0–46.0)
Hemoglobin: 9.9 g/dL — ABNORMAL LOW (ref 12.0–15.0)
MCH: 28.9 pg (ref 26.0–34.0)
MCHC: 33.1 g/dL (ref 30.0–36.0)
MCV: 87.2 fL (ref 80.0–100.0)
Platelets: 217 10*3/uL (ref 150–400)
RBC: 3.43 MIL/uL — ABNORMAL LOW (ref 3.87–5.11)
RDW: 14.8 % (ref 11.5–15.5)
WBC: 15.2 10*3/uL — ABNORMAL HIGH (ref 4.0–10.5)
nRBC: 0 % (ref 0.0–0.2)

## 2019-11-14 MED ORDER — LABETALOL HCL 200 MG PO TABS: 200.0000 mg | ORAL_TABLET | Freq: Two times a day (BID) | ORAL | 0 refills | Status: AC

## 2019-11-14 MED ORDER — ACETAMINOPHEN 325 MG PO TABS: 650.0000 mg | ORAL_TABLET | ORAL | 0 refills | Status: AC | PRN

## 2019-11-14 MED ORDER — IBUPROFEN 600 MG PO TABS: 600.0000 mg | ORAL_TABLET | Freq: Four times a day (QID) | ORAL | 0 refills | Status: AC

## 2019-11-14 NOTE — Progress Notes (Signed)
MOB was referred for history of depression/anxiety. * Referral screened out by Clinical Social Worker because none of the following criteria appear to apply: ~ History of anxiety/depression during this pregnancy, or of post-partum depression following prior delivery. ~ Diagnosis of anxiety and/or depression within last 3 years. MOB has an active Rx for Zoloft and no concerns noted in OB records. OR * MOB's symptoms currently being treated with medication and/or therapy.  Please contact the Clinical Social Worker if needs arise, by [facility] request, or if MOB scores greater than 9/yes to question 10 on [place] Postpartum Depression Screen.  Blaine Hamper, MSW, LCSW Clinical Social Work [phone]

## 2019-11-14 NOTE — Progress Notes (Signed)
Post Partum Day 1 Subjective: no complaints, up ad lib and tolerating PO  Objective: Blood pressure 133/82, pulse 92, temperature 98.8 F (37.1 C), temperature source Oral, resp. rate 18, height 5\' 4"  (1.626 m), weight 73.9 kg, last menstrual period 02/21/2019, SpO2 100 %, unknown if currently breastfeeding.  Physical Exam:  General: alert and cooperative Lochia: appropriate Uterine Fundus: firm   Recent Labs    11/13/19 0805 11/14/19 0336  HGB 10.8* 9.9*  HCT 33.7* 29.9*    Assessment/Plan: Discharge home if baby able to go BP stable on labetalol 200 mg BID  Followup 2 weeks   LOS: 1 day   01/12/20 11/14/2019, 11:44 AM

## 2019-11-14 NOTE — Lactation Note (Signed)
This note was copied from a baby's chart. Lactation Consultation Note  Patient Name: Sherry Cobb [id number] Date: 11/14/2019  P3, 10 hour female infant. LC enter room mom and infant asleep at this time.    Maternal Data    Feeding Feeding Type: Breast Fed  LATCH Score Latch: Repeated attempts needed to sustain latch, nipple held in mouth throughout feeding, stimulation needed to elicit sucking reflex.  Audible Swallowing: A few with stimulation  Type of Nipple: Everted at rest and after stimulation  Comfort (Breast/Nipple): Soft / non-tender  Hold (Positioning): No assistance needed to correctly position infant at breast.  LATCH Score: 8  Interventions    Lactation Tools Discussed/Used     Consult Status      Danelle Earthly 11/14/2019, 1:06 AM

## 2019-11-14 NOTE — Lactation Note (Signed)
This note was copied from a baby's chart. Lactation Consultation Note  Patient Name: Boy Masha Orbach [id number]'F Date: 11/14/2019 Reason for consult: Follow-up assessment;Infant < 6lbs;Early term 53-38.6wks Baby is 20 hours old.  Mom is feeling very confident with breastfeeding.  She states she can see a lot of colostrum.  Baby is staying on breast for 30 minutes.  Mom is currently not doing any extra pumping.  Discharge planned for this afternoon.  Mom has a pump at home.  Recommended post pumping to stimulate milk supply giving any expressed milk back to baby.  Mom knows what to expect with milk coming to volume.  No questions or concerns.  Reviewed outpatient services and encouraged to call prn.  Maternal Data    Feeding    LATCH Score                   Interventions    Lactation Tools Discussed/Used     Consult Status Consult Status: Complete Follow-up type: Call as needed    Huston Foley 11/14/2019, 11:02 AM

## 2019-11-14 NOTE — Discharge Summary (Signed)
OB Discharge Summary     Patient Name: Sherry Cobb DOB: 03-23-1986 MRN: [id number]  Date of admission: 11/13/2019 Delivering MD: Huel Cote   Date of discharge: 11/14/2019  Admitting diagnosis: Chronic hypertension with exacerbation during pregnancy in third trimester [O10.913] NSVD (normal spontaneous vaginal delivery) [O80]  (VBAC) Intrauterine pregnancy: [redacted]w[redacted]d     Secondary diagnosis:  Active Problems:   Chronic hypertension with exacerbation during pregnancy in third trimester   NSVD (normal spontaneous vaginal delivery) (VBAC)   Additional problems: IUGR     Discharge diagnosis: Term Pregnancy Delivered                                                                                                Post partum procedures:none  Augmentation: AROM and Pitocin  Complications: None  Hospital course:  Induction of Labor With Vaginal Delivery   34 y.o. yo [phone] at [redacted]w[redacted]d was admitted to the hospital 11/13/2019 for induction of labor.  Indication for induction: Exacerbation of hypertension and IUGR.  Patient had an uncomplicated labor course as follows: Membrane Rupture Time/Date: 8:35 AM ,11/13/2019   Intrapartum Procedures: Episiotomy: None [1]                                         Lacerations:  2nd degree [3];Perineal [11]  Patient had delivery of a Viable infant.  Information for the patient's newborn:  Declan, Adamson [[id number]]  Delivery Method: Vag-Spont    11/13/2019  Details of delivery can be found in separate delivery note.  Patient had a routine postpartum course.  Her blood pressure was well-controlled on labetalol 200mg  po BID.  Patient is discharged home 11/14/19.  Physical exam  Vitals:   11/13/19 2030 11/13/19 2254 11/14/19 0322 11/14/19 0733  BP: 139/75 133/83 (!) 148/84 133/82  Pulse:  86 87 92  Resp:  16 18 18   Temp:  98.2 F (36.8 C) 97.9 F (36.6 C) 98.8 F (37.1 C)  TempSrc:  Oral Oral Oral  SpO2:  98% 99% 100%  Weight:      Height:        General: alert and cooperative Lochia: appropriate Uterine Fundus: firm  Labs: Lab Results  Component Value Date   WBC 15.2 (H) 11/14/2019   HGB 9.9 (L) 11/14/2019   HCT 29.9 (L) 11/14/2019   MCV 87.2 11/14/2019   PLT 217 11/14/2019   CMP Latest Ref Rng & Units 11/13/2019  Glucose 70 - 99 mg/dL 85  BUN 6 - 20 mg/dL 10  Creatinine 01/12/2020 - 01/11/2020 mg/dL 0.81  Sodium 4.48 - 1.85 mmol/L 135  Potassium 3.5 - 5.1 mmol/L 4.0  Chloride 98 - 111 mmol/L 105  CO2 22 - 32 mmol/L 19(L)  Calcium 8.9 - 10.3 mg/dL 9.3  Total Protein 6.5 - 8.1 g/dL 6.3(L)  Total Bilirubin 0.3 - 1.2 mg/dL 0.5  Alkaline Phos 38 - 126 U/L 158(H)  AST 15 - 41 U/L 20  ALT 0 - 44 U/L 13    Discharge  instruction: per After Visit Summary and "Baby and Me Booklet".  After visit meds:  Allergies as of 11/14/2019   No Known Allergies     Medication List    STOP taking these medications   aspirin-acetaminophen-caffeine 250-250-65 MG tablet Commonly known as: EXCEDRIN MIGRAINE   diphenhydrAMINE 25 MG tablet Commonly known as: BENADRYL   Junel 1/20 1-20 MG-MCG tablet Generic drug: norethindrone-ethinyl estradiol   loratadine 10 MG tablet Commonly known as: CLARITIN   ondansetron 4 MG disintegrating tablet Commonly known as: Zofran ODT   oxyCODONE-acetaminophen 5-325 MG tablet Commonly known as: PERCOCET/ROXICET     TAKE these medications   acetaminophen 325 MG tablet Commonly known as: Tylenol Take 2 tablets (650 mg total) by mouth every 4 (four) hours as needed (for pain scale < 4).   ibuprofen 600 MG tablet Commonly known as: ADVIL Take 1 tablet (600 mg total) by mouth every 6 (six) hours.   labetalol 200 MG tablet Commonly known as: NORMODYNE Take 1 tablet (200 mg total) by mouth 2 (two) times daily. What changed:   medication strength  how much to take  Another medication with the same name was removed. Continue taking this medication, and follow the directions you see here.   sertraline  50 MG tablet Commonly known as: ZOLOFT Take 50 mg by mouth daily.       Diet: routine diet  Activity: Advance as tolerated. Pelvic rest for 6 weeks.   Outpatient follow up:2 weeks Follow up Appt:No future appointments. Follow up Visit:No follow-ups on file.  Postpartum contraception: Progesterone only pills  Newborn Data: Live born female  Birth Weight: 5 lb 2.9 oz (2350 g) APGAR: 9, 9  Newborn Delivery   Birth date/time: 11/13/2019 14:57:00 Delivery type: VBAC, Spontaneous      Baby Feeding: Breast Disposition:home with mother   11/14/2019 Logan Bores, MD

## 2019-11-16 ENCOUNTER — Other Ambulatory Visit (HOSPITAL_COMMUNITY): Payer: BC Managed Care – PPO

## 2019-11-16 LAB — SURGICAL PATHOLOGY

## 2019-11-18 ENCOUNTER — Inpatient Hospital Stay (HOSPITAL_COMMUNITY): Payer: BC Managed Care – PPO

## 2019-12-04 ENCOUNTER — Ambulatory Visit: Payer: BC Managed Care – PPO

## 2019-12-05 ENCOUNTER — Ambulatory Visit: Payer: BC Managed Care – PPO

## 2019-12-27 DIAGNOSIS — Z1389 Encounter for screening for other disorder: Secondary | ICD-10-CM | POA: Diagnosis not present

## 2019-12-27 DIAGNOSIS — Z3009 Encounter for other general counseling and advice on contraception: Secondary | ICD-10-CM | POA: Diagnosis not present

## 2020-01-18 DIAGNOSIS — H10413 Chronic giant papillary conjunctivitis, bilateral: Secondary | ICD-10-CM | POA: Diagnosis not present

## 2020-01-18 DIAGNOSIS — H5203 Hypermetropia, bilateral: Secondary | ICD-10-CM | POA: Diagnosis not present

## 2020-04-19 DIAGNOSIS — N200 Calculus of kidney: Secondary | ICD-10-CM | POA: Diagnosis not present

## 2020-04-19 DIAGNOSIS — Z0000 Encounter for general adult medical examination without abnormal findings: Secondary | ICD-10-CM | POA: Diagnosis not present

## 2020-04-19 DIAGNOSIS — I10 Essential (primary) hypertension: Secondary | ICD-10-CM | POA: Diagnosis not present

## 2020-04-21 DIAGNOSIS — N941 Unspecified dyspareunia: Secondary | ICD-10-CM | POA: Diagnosis not present

## 2020-04-21 DIAGNOSIS — R102 Pelvic and perineal pain: Secondary | ICD-10-CM | POA: Diagnosis not present

## 2020-05-04 DIAGNOSIS — Z87442 Personal history of urinary calculi: Secondary | ICD-10-CM | POA: Diagnosis not present

## 2020-05-04 DIAGNOSIS — N200 Calculus of kidney: Secondary | ICD-10-CM | POA: Diagnosis not present

## 2020-06-21 DIAGNOSIS — L600 Ingrowing nail: Secondary | ICD-10-CM | POA: Diagnosis not present

## 2020-06-23 DIAGNOSIS — F411 Generalized anxiety disorder: Secondary | ICD-10-CM | POA: Diagnosis not present

## 2020-10-09 ENCOUNTER — Telehealth (INDEPENDENT_AMBULATORY_CARE_PROVIDER_SITE_OTHER): Admitting: Family Medicine

## 2020-10-09 ENCOUNTER — Telehealth (INDEPENDENT_AMBULATORY_CARE_PROVIDER_SITE_OTHER): Payer: Self-pay

## 2020-10-09 NOTE — Telephone Encounter (Signed)
 Pt rescheduled

## 2020-10-09 NOTE — Telephone Encounter (Signed)
 LVMTCB - pt needs to reschedule 1240 PD with another provider or another day

## 2020-10-10 ENCOUNTER — Other Ambulatory Visit (INDEPENDENT_AMBULATORY_CARE_PROVIDER_SITE_OTHER)

## 2020-10-10 ENCOUNTER — Telehealth (INDEPENDENT_AMBULATORY_CARE_PROVIDER_SITE_OTHER): Admitting: Registered Nurse

## 2020-10-10 ENCOUNTER — Encounter (INDEPENDENT_AMBULATORY_CARE_PROVIDER_SITE_OTHER): Payer: Self-pay | Admitting: Registered Nurse

## 2020-10-10 LAB — COVID-19 ANTIGEN POCT: COVID-19 Antigen (POCT): NOT DETECTED

## 2020-10-10 NOTE — Interdisciplinary (Signed)
 Verified patient with two identifiers.   Collected Covid Test swab from patient and submitted to lab.   Patient instructed to isolate while waiting for test results.   Swab performed by: Glorious K  Rapid tests: Rapid Covid  Rapid test results: Negative

## 2020-10-10 NOTE — Addendum Note (Signed)
 Addended by: Alegra Rost on: 10/10/2020 07:17 PM     Modules accepted: Level of Service

## 2020-10-10 NOTE — Progress Notes (Signed)
 [facility] Video Visit    Interactive real time audio and visual communication used for the telehealth visit.   HPI   Katrina Lam is a 35 year old female who presents to clinic for Exposure to COVID-19 (headaches)    Exposed on 12/27 - was outdoors eating dinner together for 1 hour. Individual tested positive the next day. Individual was not symptomatic on day of exposure (never developed symptoms) but was tested because someone else in their household was positive.   Patient has been having some headaches but no other URI symptoms. Patient is fully vaccinated + booster. Patient already scheduled for PCR today.    Flying to [place] January 12th (mid-day). Needs test within 48 hours of flight. Thinks rapid may be accepted. Would like to be scheduled for rapid and PCR on 01/10      REVIEW OF SYSTEMS    Negative except for those in HPI      PAST MEDICAL AND SURGICAL HISTORY:    has no past surgical history on file.  History reviewed. No pertinent past medical history.     CURRENT MEDICATIONS:  No current outpatient medications on file.     ALLERGIES:  Patient has no known allergies.    FAMILY HISTORY:  family history is not on file.    SOCIAL HISTORY:    reports that she has never smoked. She has never used smokeless tobacco. She reports previous alcohol use. She reports that she does not use drugs.     PHYSICAL EXAMINATION    There were no vitals filed for this visit.  There is no height or weight on file to calculate BMI.  Wt Readings from Last 5 Encounters:   No data found for Wt     Blood Pressure   No data found for BP       GEN: in NAD, A&O, pleasant, non-toxic appearing. Exam limited by telemdicine  EENT: No injection or icterus.   Psych: Alert and Oriented. Affect appropriate.       ASSESSMENT AND PLAN    1. Exposure to COVID-19 virus  Covid-19 test ordered, patient to be scheduled for drive through testing. Patient aware to mask and practice social distancing until results are recevied.     - SARS-CoV-2, NAA  - [facility]    2. Encounter for screening for COVID-19  Covid testing for travel ordered, patient to be scheduled in the afternoon on January 10. Patient aware that PCR test may not come back in time for flight. Rapid and PCR ordered. Patient was instructed to confirm that rapid test is accepted.      - Covid-19 Antigen (POCT)  - SARS-CoV-2, NAA - [facility]    Instructions:  Instructed patient to go to drive up testing site for COVID swab. Appointment made.  Discussed continue to practice precautions with masks, hand washing, distancing.  Discussed false negatives do happen. If new symptoms should develop patient should do another visit for repeat testing.   Patient agreed.    FOLLOW UP   No follow-ups on file.    Jetaime Pinnix, NP

## 2020-10-12 ENCOUNTER — Encounter (INDEPENDENT_AMBULATORY_CARE_PROVIDER_SITE_OTHER): Payer: Self-pay | Admitting: Registered Nurse

## 2020-10-12 LAB — SARS-COV-2, NAA - LABCORP: COVID-19 Coronavirus PCR - LabCorp: NOT DETECTED

## 2020-10-12 LAB — SARS-COV-2, NAA 2 DAY TAT-LABCORP

## 2020-10-16 ENCOUNTER — Other Ambulatory Visit (INDEPENDENT_AMBULATORY_CARE_PROVIDER_SITE_OTHER)

## 2020-10-20 ENCOUNTER — Telehealth (INDEPENDENT_AMBULATORY_CARE_PROVIDER_SITE_OTHER): Admitting: Internal Medicine

## 2020-10-27 ENCOUNTER — Encounter (INDEPENDENT_AMBULATORY_CARE_PROVIDER_SITE_OTHER): Payer: Self-pay | Admitting: Physician Assistant

## 2020-10-27 ENCOUNTER — Other Ambulatory Visit (INDEPENDENT_AMBULATORY_CARE_PROVIDER_SITE_OTHER)

## 2020-10-27 ENCOUNTER — Telehealth (INDEPENDENT_AMBULATORY_CARE_PROVIDER_SITE_OTHER): Admitting: Physician Assistant

## 2020-10-27 LAB — COVID-19 ANTIGEN POCT: COVID-19 Antigen (POCT): NOT DETECTED

## 2020-10-27 NOTE — Patient Instructions (Addendum)
 PCR tests for COVID take 1-2 days to come back. Negative tests are immediately available in MyChart. Our clinic will call and schedule a follow up appointment for all positive tests.     If you are fully vaccinated and had an exposure, you do not need to quarantine, unless your become symptomatic. But should wear a well fitting mask for 10 days following exposure.   For people who are unvaccinated or are more than six months out from their second mRNA dose (or more than 2 months after the J&J vaccine) and not yet boosted, CDC now recommends quarantine for 5 days followed by strict mask use for an additional 5 days.    If your test is positive, given what we currently know about COVID-19 and the Omicron variant, CDC is shortening the recommended time for isolation for the public. People with COVID-19 should isolate for 5 days and if they are asymptomatic or their symptoms are resolving (without fever for 24 hours), follow that by 5 days of wearing a mask when around others to minimize the risk of infecting people they encounter. The change is motivated by science demonstrating that the majority of SARS-CoV-2 transmission occurs early in the course of illness, generally in the 1-2 days prior to onset of symptoms and the 2-3 days after.    If you develop difficulty breathing or have trouble catching your breath at rest, please go to the nearest Emergency Department.         Ending isolation for people who had COVID-19 and had symptoms  If you had COVID-19 and had symptoms, isolate for at least 5 days. To calculate your 5-day isolation period, day 0 is your first day of symptoms. Day 1 is the first full day after your symptoms developed. You can leave isolation after 5 full days.    - You can end isolation after 5 full days if you are fever-free for 24 hours without the use of fever-reducing medication and your other symptoms have improved (Loss of taste and smell may persist for weeks or months after recovery and need  not delay the end of isolation).  - You should continue to wear a well-fitting mask around others at home and in public for 5 additional days (day 6 through day 10) after the end of your 5-day isolation period. If you are unable to wear a mask when around others, you should continue to isolate for a full 10 days. Avoid people who are immunocompromised or at high risk for severe disease, and nursing homes and other high-risk settings, until after at least 10 days.  - If you continue to have fever or your other symptoms have not improved after 5 days of isolation, you should wait to end your isolation until you are fever-free for 24 hours without the use of fever-reducing medication and your other symptoms have improved. Continue to wear a well-fitting mask. Contact your healthcare provider if you have questions.  - Do not travel during your 5-day isolation period. After you end isolation, avoid travel until a full 10 days after your first day of symptoms. If you must travel on days 6-10, wear a well-fitting mask when you are around others for the entire duration of travel. If you are unable to wear a mask, you should not travel during the 10 days.  - Do not go to places where you are unable to wear a mask, such as restaurants and some gyms, and avoid eating around others at home and at  work until a full 10 days after your first day of symptoms.    If an individual has access to a test and wants to test, the best approach is to use an antigen test1 towards the end of the 5-day isolation period. Collect the test sample only if you are fever-free for 24 hours without the use of fever-reducing medication and your other symptoms have improved (loss of taste and smell may persist for weeks or months after recovery and need not delay the end of isolation). If your test result is positive, you should continue to isolate until day 10. If your test result is negative,  you can end isolation, but continue to wear a well-fitting  mask around others at home and in public until day 10.    Ending isolation for people who tested positive for COVID-19 but had NO symptoms  If you test positive for COVID-19 and never develop symptoms, isolate for at least 5 days. Day 0 is the day of your positive viral test (based on the date you were tested) and day 1 is the first full day after the specimen was collected for your positive test. You can leave isolation after 5 full days.    - If you continue to have no symptoms, you can end isolation after at least 5 days.  You should continue to wear a well-fitting mask around others at home and in public until day 10 (day 6 through day 10). If you are unable to wear a mask when around others, you should continue to isolate for 10 days. Avoid people who are immunocompromised or at high risk for severe disease, and nursing homes and other high-risk settings, until after at least 10 days.  - If you develop symptoms after testing positive, your 5-day isolation period should start over. Day 0 is your first day of symptoms. Follow the recommendations above for ending isolation for people who had COVID-19 and had symptoms.  - Do not travel during your 5-day isolation period. After you end isolation, avoid travel until 10 days after the day of your positive test. If you must travel on days 6-10, wear a well-fitting mask when you are around others for the entire duration of travel. If you are unable to wear a mask, you should not travel during the 10 days after your positive test.  - Do not go to places where you are unable to wear a mask, such as restaurants and some gyms, and avoid eating around others at home and at work until 10 days after the day of your positive test.    If an individual has access to a test and wants to test, the best approach is to use an antigen test1 towards the end of the 5-day isolation period. If your test result is positive, you should continue to isolate until day 10. If your test result  is negative, you can end isolation, but continue to wear a well-fitting mask around others at home and in public until day 10.

## 2020-10-27 NOTE — Interdisciplinary (Signed)
 Verified patient with two identifiers. Collected pcr swab from patient and submitted to lab.  Patient instructed to isolate while waiting for test results. Swab performed Katrina Lam    Rapid covid tests: negative

## 2020-10-27 NOTE — Progress Notes (Signed)
 [facility] Video Visit    Interactive real time audio and visual communication used for the telehealth visit.   HPI   Katrina Lam is a 35 year old female who presents to [facility] for covid testing    The patient was exposed to COVID-19 on five days ago and two days ago. She was at a large work Chart[employer] certified [employer] and knows several people that she was around have since tested positive.   The patient developed a scratchy (not sore) throat this morning. Day of illness onset 10/27/20  Denies fever, dyspnea, cough, loss of taste or smell, fatigue, diarrhea, nausea, vomiting, sore throat, rhinorrhea, headaches, myalgias, chills, confusion    Vaccinated against COVID-19 yes, booster yes.     REVIEW OF SYSTEMS    Negative except for those in HPI      PAST MEDICAL AND SURGICAL HISTORY:    has no past surgical history on file.  History reviewed. No pertinent past medical history.     CURRENT MEDICATIONS:  No current outpatient medications on file.     ALLERGIES:  Patient has no known allergies.    FAMILY HISTORY:  family history is not on file.    SOCIAL HISTORY:     Social History     Tobacco Use   . Smoking status: Never Smoker   . Smokeless tobacco: Never Used   Substance Use Topics   . Alcohol use: Not Currently   . Drug use: Never        PHYSICAL EXAMINATION    There were no vitals filed for this visit.  There is no height or weight on file to calculate BMI.  Wt Readings from Last 5 Encounters:   No data found for Wt     Blood Pressure   No data found for BP       Exam limited by telemedicine platform.   GEN: in NAD, A&O, pleasant, non-toxic appearing  EENT: No injection or icterus.   Psych: Alert and Oriented. Affect appropriate.       ASSESSMENT AND PLAN    1. Encounter by telehealth for suspected COVID-19  - SARS-CoV-2, NAA - [facility]; Future  - Covid-19 Antigen (POCT)  - SARS-CoV-2, NAA - [facility]    2. Exposure to confirmed case of COVID-19  - SARS-CoV-2, NAA - [facility]; Future  - Covid-19 Antigen (POCT)  - SARS-CoV-2, NAA  - [facility]    Instructions:  Instructed patient to go to drive up testing site for COVID swab. Appointment made.  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   Discussed false negatives do happen. If new symptoms should develop and the initial test was negative, patient should do another visit for repeat testing.   Go to the emergency department for dyspnea at rest.   Patient agreed.    Ending isolation for people who had COVID-19 and had symptoms  If you had COVID-19 and had symptoms, isolate for at least 5 days. To calculate your 5-day isolation period, day 0 is your first day of symptoms. Day 1 is the first full day after your symptoms developed. You can leave isolation after 5 full days.    - You can end isolation after 5 full days if you are fever-free for 24 hours without the use of fever-reducing medication and your other symptoms have improved (Loss of taste and smell may persist for weeks or months after recovery  and need not delay the end of isolation).  - You should continue to wear a well-fitting mask around others at home and in public for 5 additional days (day 6 through day 10) after the end of your 5-day isolation period. If you are unable to wear a mask when around others, you should continue to isolate for a full 10 days. Avoid people who are immunocompromised or at high risk for severe disease, and nursing homes and other high-risk settings, until after at least 10 days.  - If you continue to have fever or your other symptoms have not improved after 5 days of isolation, you should wait to end your isolation until you are fever-free for 24 hours without the use of fever-reducing medication and your other symptoms have improved. Continue to wear a well-fitting mask. Contact your healthcare provider if you have questions.  - Do not travel during your 5-day isolation period. After you end  isolation, avoid travel until a full 10 days after your first day of symptoms. If you must travel on days 6-10, wear a well-fitting mask when you are around others for the entire duration of travel. If you are unable to wear a mask, you should not travel during the 10 days.  - Do not go to places where you are unable to wear a mask, such as restaurants and some gyms, and avoid eating around others at home and at work until a full 10 days after your first day of symptoms.    If an individual has access to a test and wants to test, the best approach is to use an antigen test1 towards the end of the 5-day isolation period. Collect the test sample only if you are fever-free for 24 hours without the use of fever-reducing medication and your other symptoms have improved (loss of taste and smell may persist for weeks or months after recovery and need not delay the end of isolation). If your test result is positive, you should continue to isolate until day 10. If your test result is negative,  you can end isolation, but continue to wear a well-fitting mask around others at home and in public until day 10.    Ending isolation for people who tested positive for COVID-19 but had NO symptoms  If you test positive for COVID-19 and never develop symptoms, isolate for at least 5 days. Day 0 is the day of your positive viral test (based on the date you were tested) and day 1 is the first full day after the specimen was collected for your positive test. You can leave isolation after 5 full days.    - If you continue to have no symptoms, you can end isolation after at least 5 days.  You should continue to wear a well-fitting mask around others at home and in public until day 10 (day 6 through day 10). If you are unable to wear a mask when around others, you should continue to isolate for 10 days. Avoid people who are immunocompromised or at high risk for severe disease, and nursing homes and other high-risk settings, until after at  least 10 days.  - If you develop symptoms after testing positive, your 5-day isolation period should start over. Day 0 is your first day of symptoms. Follow the recommendations above for ending isolation for people who had COVID-19 and had symptoms.  - Do not travel during your 5-day isolation period. After you end isolation, avoid travel until 10 days after the day  of your positive test. If you must travel on days 6-10, wear a well-fitting mask when you are around others for the entire duration of travel. If you are unable to wear a mask, you should not travel during the 10 days after your positive test.  - Do not go to places where you are unable to wear a mask, such as restaurants and some gyms, and avoid eating around others at home and at work until 10 days after the day of your positive test.    If an individual has access to a test and wants to test, the best approach is to use an antigen test1 towards the end of the 5-day isolation period. If your test result is positive, you should continue to isolate until day 10. If your test result is negative, you can end isolation, but continue to wear a well-fitting mask around others at home and in public until day 10.      FOLLOW UP   As needed    Margret Shepherd, PA

## 2020-10-28 LAB — SARS-COV-2, NAA - LABCORP: COVID-19 Coronavirus PCR - LabCorp: NOT DETECTED

## 2020-10-28 LAB — SARS-COV-2, NAA 2 DAY TAT-LABCORP

## 2020-10-30 ENCOUNTER — Telehealth (INDEPENDENT_AMBULATORY_CARE_PROVIDER_SITE_OTHER): Admitting: Registered Nurse

## 2020-10-30 ENCOUNTER — Other Ambulatory Visit (INDEPENDENT_AMBULATORY_CARE_PROVIDER_SITE_OTHER)

## 2020-10-30 ENCOUNTER — Encounter (INDEPENDENT_AMBULATORY_CARE_PROVIDER_SITE_OTHER): Payer: Self-pay | Admitting: Registered Nurse

## 2020-10-30 LAB — COVID-19 ANTIGEN POCT: COVID-19 Antigen (POCT): NOT DETECTED

## 2020-10-30 NOTE — Interdisciplinary (Signed)
 Verified patient with two identifiers. Collected pcr swab from patient and submitted to lab.  Patient instructed to isolate while waiting for test results. Swab performed Katrina Lam    Rapid covid tests: negative

## 2020-10-30 NOTE — Progress Notes (Signed)
 [facility] Video Visit  Interactive real time audio and visual communication used for the telehealth visit.   HPI   Katrina Lam is a 35 year old female who presents via video visit for COVID testing.     COVID infection history: None    Vaccination status: Vaccinated + boosted.    Exposure history: Wednesday, 10/25/20 at work event. Initial testing 10/27/20 via Ag + PCR neg, results reviewed in chart.     Symptom history:   - Onset Friday with congestion and a bit of a scratchy throat.   - Denies fever, dyspnea, vomiting.     REVIEW OF SYSTEMS    Negative except for those in HPI above.    MEDICATIONS AND ALLERGIES   ALLERGIES:  Patient has no known allergies.    CURRENT MEDICATIONS:  No current outpatient medications on file.     HISTORY   PAST MEDICAL AND SURGICAL HISTORY:    has no past surgical history on file.  No past medical history on file.     FAMILY HISTORY:  family history is not on file.    SOCIAL HISTORY:     Social History     Tobacco Use   . Smoking status: Never Smoker   . Smokeless tobacco: Never Used   Substance Use Topics   . Alcohol use: Not Currently   . Drug use: Never        PHYSICAL EXAMINATION    There were no vitals filed for this visit.  There is no height or weight on file to calculate BMI.  Wt Readings from Last 5 Encounters:   No data found for Wt     Blood Pressure   No data found for BP     - General: In NAD, A&O, pleasant, non-toxic appearing.  - Respiratory: No increased work of breathing.  - HEENT: No injection or icterus.   - Neurologic: Alert and oriented. Mentation appropriate. No visible tremor.    ASSESSMENT AND PLAN    1. Exposure to COVID-19 virus  2. Encounter for laboratory testing for COVID-19 virus  Repeat COVID testing, today is day 5 after most recent exposure.     Orders:   - Covid-19 Antigen (POCT)  - SARS-CoV-2, NAA - [facility]; Future  - SARS-CoV-2, NAA - [facility]      Instructions:  - Go to testing site for COVID swab. Appointment made.  - Follow current CDC and  [place]  [facility] quarantine and isolation guidelines.   - Use a high-quality mask, social distancing, and hand hygiene.   - False negatives do happen. If noticing new or persistent symptoms despite an initial negative test, patient should complete another visit for repeat testing.    - Test result will be received via MyChart if negative.   - Monitor for symptoms and seek medical attention if concerns. Go to the emergency department for dyspnea at rest.     FOLLOW UP   Return today (on 10/30/2020) for COVID testing.    Fate Honor, NP     TIME STATEMENT   On the day of the visit, I spent:  - 2 minutes prior to visit reviewing the stated reason for visit and chart.  - 4 minutes conducting the visit including assessing the patient, formulating a plan, and communicating recommendations.   - 5 minutes after the visit placing orders, arranging for follow up, and documenting.   Total time of 11 minutes on the care of the patient.

## 2020-11-01 ENCOUNTER — Encounter (INDEPENDENT_AMBULATORY_CARE_PROVIDER_SITE_OTHER): Payer: Self-pay | Admitting: Registered Nurse

## 2020-11-01 LAB — SARS-COV-2, NAA 2 DAY TAT-LABCORP

## 2020-11-01 LAB — SARS-COV-2, NAA - LABCORP: COVID-19 Coronavirus PCR - LabCorp: NOT DETECTED

## 2021-02-06 ENCOUNTER — Telehealth (HOSPITAL_BASED_OUTPATIENT_CLINIC_OR_DEPARTMENT_OTHER): Payer: Self-pay

## 2021-02-06 ENCOUNTER — Encounter (HOSPITAL_BASED_OUTPATIENT_CLINIC_OR_DEPARTMENT_OTHER): Payer: Self-pay | Admitting: Nurse Practitioner

## 2021-02-06 DIAGNOSIS — D693 Immune thrombocytopenic purpura: Secondary | ICD-10-CM

## 2021-02-06 NOTE — Telephone Encounter (Signed)
1st attempt    Called patient, left a detailed message.     Per triage physician, Dr Jimmie Molly, MD  You 29 minutes ago (2:57 PM)         35 year old with iron deficiency and thrombocytosis., Routine, next available    Message text

## 2021-02-06 NOTE — Telephone Encounter (Signed)
Request Summary [[id number]]   Procedure: Consult/Referral to Hematology/BMT Status: Needs Scheduling   Requested appt date:  Authorizing: Elby Showers NP   Referral: [id number] (Authorized)       Priority: Routine   Diagnosis: Idiopathic thrombocytopenia (CMS-HCC) [D69.3]   Order Specific Questions   Appointment For:   Indications:   35 y/o f with known Idiopathic Thrombocytosis. Referral, labs in media.        NOTE: Additional Records from [facility], including lab and FISH results in CareEverywhere from 2016/2017.     1. Routing encounter to hematology for review and scheduling.     Thank you,  Katrina Lam. Shawnie Dapper  Practice Support Specialist [facility]  Physician Access Services   [facility]

## 2021-02-06 NOTE — Telephone Encounter (Signed)
-   Routing referral to Heme-onc triage provider, Dr. Gopal for review and advise on the following:        · Physician assignment   · Scheduling urgency   · HTTC or BMT eligible

## 2021-02-07 ENCOUNTER — Other Ambulatory Visit (INDEPENDENT_AMBULATORY_CARE_PROVIDER_SITE_OTHER)

## 2021-02-07 ENCOUNTER — Encounter (HOSPITAL_BASED_OUTPATIENT_CLINIC_OR_DEPARTMENT_OTHER): Payer: Self-pay | Admitting: Hospital

## 2021-02-07 ENCOUNTER — Telehealth: Admitting: Physician Assistant

## 2021-02-07 LAB — COVID-19 ANTIGEN POCT: COVID-19 Antigen (POCT): NOT DETECTED

## 2021-02-07 NOTE — Telephone Encounter (Signed)
Patient has been scheduled with Dr. Artelia Laroche on 6/8 at 8:30 AM @ MON. Patient is aware of VP, date, time and location of appt.

## 2021-02-07 NOTE — Patient Instructions (Signed)
 PCR tests for COVID take 1-2 days to come back. Negative tests are immediately available in MyChart. Our clinic will call and schedule a follow up appointment for all positive tests.     If you are fully vaccinated and had an exposure, you do not need to quarantine, unless your become symptomatic. But should wear a well fitting mask for 10 days following exposure.   For people who are unvaccinated or are more than six months out from their second mRNA dose (or more than 2 months after the J&J vaccine) and not yet boosted, CDC now recommends quarantine for 5 days followed by strict mask use for an additional 5 days.    If your test is positive, given what we currently know about COVID-19 and the Omicron variant, CDC is shortening the recommended time for isolation for the public. People with COVID-19 should isolate for 5 days and if they are asymptomatic or their symptoms are resolving (without fever for 24 hours), follow that by 5 days of wearing a mask when around others to minimize the risk of infecting people they encounter. The change is motivated by science demonstrating that the majority of SARS-CoV-2 transmission occurs early in the course of illness, generally in the 1-2 days prior to onset of symptoms and the 2-3 days after.    If you develop difficulty breathing or have trouble catching your breath at rest, please go to the nearest Emergency Department.

## 2021-02-07 NOTE — Progress Notes (Signed)
 [facility] Phone Visit    Interactive real time audio communication used for the telehealth visit.   HPI   Nollie Shiflett is a 35 year old female who presents to telephone for covid symptoms    Symptoms started four days ago. Started with sore throat, cough, progressed to congestion, headache, chills, fatigue. Sore throat has improved. No fever, chills, body aches, SOB, nausea, vomiting, diarrhea. Decreased appetite.   Symptoms include:   Fever/chills: no fever, yes chills  Malaise/myalgias: yes fatigue, denies muscle aches  Cough: yes  Wheeze or shortness of breath: denies  Headache: yes  Sore throat: yes, improved  Loss of taste or smell: denies  New diarrhea:denies  Rhinorrhea: yes    Known COVID-19 exposures: denies  COVID-19 vaccine: yes, booster: yes    REVIEW OF SYSTEMS    Negative except for those in HPI      PAST MEDICAL AND SURGICAL HISTORY:    has no past surgical history on file.  No past medical history on file.     CURRENT MEDICATIONS:  No current outpatient medications on file.     ALLERGIES:  Patient has no known allergies.    FAMILY HISTORY:  family history is not on file.    SOCIAL HISTORY:     Social History     Socioeconomic History   . Marital status: Single   Tobacco Use   . Smoking status: Never Smoker   . Smokeless tobacco: Never Used   Substance and Sexual Activity   . Alcohol use: Not Currently   . Drug use: Never        PHYSICAL EXAMINATION    There were no vitals filed for this visit.  There is no height or weight on file to calculate BMI.  Wt Readings from Last 5 Encounters:   No data found for Wt     Blood Pressure   No data found for BP       Exam limited by telemedicine platform.   GEN: in NAD, A&O, pleasant, non-toxic appearing  EENT: No injection or icterus.   Psych: Alert and Oriented. Affect appropriate.       ASSESSMENT AND PLAN    1. Encounter by telehealth for suspected COVID-19  - SARS-CoV-2, NAA - [facility]; Future  - Covid-19 Antigen (POCT)  - SARS-CoV-2, NAA -  [facility]    Drive by testing scheduled  Discussed false negatives do occur. If symptoms worsen and initial test is negative, do a follow up visit for repeat testing. Quarantine until results are received and symptoms have resolved. If test is positive we will do a follow up visit to discuss how long to quarantine.   Test will be received via mychart if negative.   Go to the emergency department for dyspnea at rest  Patient agreed  Discussed CDC isolation recommendations if test is positive.     Time spent on phone: 9 minutes     FOLLOW UP   Return if symptoms worsen or fail to improve.    Helen Hashimoto, PA

## 2021-02-07 NOTE — Telephone Encounter (Signed)
2nd attempt:     Called patient, left a detailed message.    - unable to contact letter mailed to patient.

## 2021-02-07 NOTE — Interdisciplinary (Signed)
Verified patient with two identifiers. Collected PCR swab from patient and submitted to lab.  Patient instructed to isolate while waiting for test results. Swab performed Katrina Lam    Rapid Covid Test: Negative

## 2021-02-08 LAB — SARS-COV-2, NAA - LABCORP: COVID-19 Coronavirus PCR - LabCorp: NOT DETECTED

## 2021-02-09 ENCOUNTER — Encounter (INDEPENDENT_AMBULATORY_CARE_PROVIDER_SITE_OTHER): Payer: Self-pay | Admitting: Physician Assistant

## 2021-03-12 ENCOUNTER — Telehealth (HOSPITAL_BASED_OUTPATIENT_CLINIC_OR_DEPARTMENT_OTHER): Payer: Self-pay

## 2021-03-12 NOTE — Telephone Encounter (Signed)
Navigator reached out to patient, call sent to voicemail and navigator left message for patient to call me back along with my contact information and phone number.

## 2021-03-13 ENCOUNTER — Other Ambulatory Visit: Payer: Self-pay

## 2021-03-14 ENCOUNTER — Other Ambulatory Visit (HOSPITAL_BASED_OUTPATIENT_CLINIC_OR_DEPARTMENT_OTHER): Payer: Commercial Managed Care - PPO

## 2021-03-14 ENCOUNTER — Ambulatory Visit: Payer: Commercial Managed Care - PPO | Attending: Hematology & Oncology | Admitting: Hematology & Oncology

## 2021-03-14 VITALS — BP 139/86 | HR 76 | Temp 97.7°F | Resp 16 | Ht 66.0 in | Wt 250.7 lb

## 2021-03-14 DIAGNOSIS — E611 Iron deficiency: Secondary | ICD-10-CM | POA: Insufficient documentation

## 2021-03-14 DIAGNOSIS — D693 Immune thrombocytopenic purpura: Secondary | ICD-10-CM | POA: Insufficient documentation

## 2021-03-14 DIAGNOSIS — D75839 Thrombocytosis, unspecified: Secondary | ICD-10-CM

## 2021-03-14 LAB — CBC WITH DIFF, BLOOD
ANC-Automated: 5.8 10*3/uL (ref 1.6–7.0)
Abs Basophils: 0.1 10*3/uL — ABNORMAL HIGH (ref <0.1)
Abs Eosinophils: 0.1 10*3/uL (ref 0.0–0.5)
Abs Lymphs: 2.4 10*3/uL (ref 0.8–3.1)
Abs Monos: 0.6 10*3/uL (ref 0.2–0.8)
Basophils: 1 %
Eosinophils: 1 %
Hct: 37.6 % (ref 34.0–45.0)
Hgb: 11.1 gm/dL — ABNORMAL LOW (ref 11.2–15.7)
Lymphocytes: 27 %
MCH: 23.6 pg — ABNORMAL LOW (ref 26.0–32.0)
MCHC: 29.5 g/dL — ABNORMAL LOW (ref 32.0–36.0)
MCV: 80 um3 (ref 79.0–95.0)
MPV: 10.8 fL (ref 9.4–12.4)
Monocytes: 6 %
Plt Count: 532 10*3/uL — ABNORMAL HIGH (ref 140–370)
RBC: 4.7 10*6/uL (ref 3.90–5.20)
RDW: 16.8 % — ABNORMAL HIGH (ref 12.0–14.0)
Segs: 65 %
WBC: 8.9 10*3/uL (ref 4.0–10.0)

## 2021-03-14 LAB — FERRITIN, BLOOD: Ferritin: 15 ng/mL (ref 13–150)

## 2021-03-14 LAB — COMPREHENSIVE METABOLIC PANEL, BLOOD
ALT (SGPT): 10 U/L (ref 0–33)
AST (SGOT): 14 U/L (ref 0–32)
Albumin: 4.4 g/dL (ref 3.5–5.2)
Alkaline Phos: 87 U/L (ref 35–104)
Anion Gap: 10 mmol/L (ref 7–15)
BUN: 9 mg/dL (ref 6–20)
Bicarbonate: 23 mmol/L (ref 22–29)
Bilirubin, Tot: 0.24 mg/dL (ref <1.2)
Calcium: 9.1 mg/dL (ref 8.5–10.6)
Chloride: 107 mmol/L (ref 98–107)
Creatinine: 0.56 mg/dL (ref 0.51–0.95)
GFR: >60 mL/min
Glucose: 91 mg/dL (ref 70–99)
Potassium: 4.3 mmol/L (ref 3.5–5.1)
Sodium: 140 mmol/L (ref 136–145)
Total Protein: 7.2 g/dL (ref 6.0–8.0)
eGFR Based on CKD-EPI 2021 Equation: >60 mL/min

## 2021-03-14 LAB — IBC - IRON BINDING CAPACITY
Iron Saturation: 5 %
Iron: 17 ug/dL — ABNORMAL LOW (ref 37–145)
Total IBC: 332 ug/dL (ref 148–506)
UIBC: 315 ug/dL (ref 112–346)

## 2021-03-14 LAB — RETICULOCYTES AUTOMATED, BLOOD
Retic %, Auto: 1.4 % (ref 0.5–1.8)
Retic Count, Absolute: 66 10*3/uL (ref 16.0–78.0)

## 2021-03-14 MED ORDER — IRON PO
18.00 mg | ORAL | Status: DC
Start: ? — End: 2023-01-16

## 2021-03-14 MED ORDER — VITAMIN D3 PO: ORAL | Status: AC

## 2021-03-14 NOTE — Goals of Care (Signed)
Advance Care Planning    What gives the patient's life meaning?  Family, friends, travel      Who would make medical decisions for the patient if they are unable to make decisions for themselves?   My partner, Allegra Lai  [phone]    Based on above information I recommended the following:  completion of an advance directive Advanced Directive packet given to pt    Total time spent face-to-face with patient and/or surrogate decision maker providing counseling related to advance care planning:   5 minutes

## 2021-03-14 NOTE — Patient Instructions (Addendum)
PATIENT INSTRUCTIONS:     1.) Please have your labs done today.   2.) Please call your insurance to determine what lab is covered.   3.) Please take your lab requests with you..   4.) Infusion will call you for IV iron.  5) Please schedule revisit with Dr Artelia Laroche  in 6 months with labs prior.         Physician:             Burman Blacksmith, MD  Physician Assistant: Althia Forts, PA-C    [facility]  [address]  [place], [place] [address]  Phone: [phone]  Fax: [phone]    [facility]   [address]  [place], [place] [address]  Hours: 08:00am - 5:00pm  Phone: [phone]                    Phone List for Patients    Hours of Operation:  Monday-Friday 8:00- 5:00p.m. (Closed Holidays and Weekends)         AFTER HOURS EMERGENCY NUMBER    [phone]) [phone]     **Ask for the Hematology/Oncology Physician On-Call.                                                                                Physician: Burman Blacksmith, MD  Nurse Practitioner:  Loraine Maple, NP    Administrative Assistant:  Beckie Salts  Phone:    [phone] Fax: [phone]  **Forms and letters take 3-5 business days to prepare**    Nurse Case Manager:  Matt Holmes   Phone:   [phone] Fax: [phone]  **Please note we need at least 72 hours notice for any medication refills.     Social Services:     Henri Medal, LCSW   [facility]  Phone: [phone]        Percival Spanish, LCSW   [facility]  Phone: [phone]                 Information Desk:  [phone]  ** General information: directions, telephone numbers, or available services.    Cancer Center Clinic:  [phone]  **Call to schedule, cancel, or reschedule clinic appointments.    Infusion Center:    [phone]) [phone] [facility])     [phone]  [facility])    Hours: Monday-Friday  7:30am-7:30pm; Sat-Sun-Holidays  8:00am-6:00pm-  ** Call to schedule, cancel, or reschedule   appointments    Financial Counselors:    De Hollingshead  Phone: [phone]  Fax [phone]                                           [facility]:  [phone]     Open Monday-Friday 9:00am -5:00pm, closed Holidays and Weekends    [facility] Lab:  [phone]    Open Monday-Friday  8:00am -5:00pm, closed Holidays and Weekends    Registration (to update personal information)[phone]  Doctor Referral Line  [phone]  Medical Records    [phone]  [facility] Scheduling)  [phone]  Interventional Radiology                             [phone]   Radiation Oncology                                     [phone]    [facility] [place]:  [phone]      Follow-up care is a key part of your treatment and safety. Be sure to make and go to all appointments, and call us if you are having problems. Its also a good idea to know your test results and keep a list of the medicines you take and bring them to each appointment  -  Our Mission is to deliver outstanding patient care through commitment to the community, groundbreaking research and inspired teaching.     Our Vision is to create a healthier world ~ one life at a time ~ through new science, new medicine and new cures.     [facility]   [address] [phone]

## 2021-03-14 NOTE — Interdisciplinary (Signed)
PATIENT NAME: Katrina Lam, is a 35 year old female    New Patient appointment with Dr. Artelia Laroche      Patient is ambulatory to clinic, is unaccompanied, alert and oriented to self, location, time and situation. Patient is aware of reason for visit.       Special Needs:   n/a  Barriers to learning:   n/a  Nursing Assessment:  LMP: 03/12/2019, Pt has not had Covid Virus. She is vaccinated x one booster, No smoking,no smokeless tobacco, no drug use, ETOH, 2 drinks per week. 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   Patient denies c/o any pain during this visit/ Pain level is 0/10 in n/a  Interventions:  Supportive care given. Personal difficulties related to treatment addressed. Patient/family concerns addressed.      Education: Reviewed plan, office contacts and AVS given to patient.       Questions asked and answered to patient's satisfaction and understanding.       Plan:  Please have your labs done today.   2.) Please call your insurance to determine what lab is covered.   3.) Please take your lab requests with you..   4.) Infusion will call you for IV iron.  5) Please schedule revisit with Dr Artelia Laroche with labs prior.   SEE AVS    Lynnda Shields, BSN, RN  Covering Nurse Case Manager for Dr. Burman Blacksmith

## 2021-03-14 NOTE — Progress Notes (Signed)
Hematology Consultation        Demographics:  Date: March 14, 2021   Patient Name: Katrina Lam   Medical Record #: [id number]   DOB: 03/27/1986  Age: 35 year old  Sex: female      REQUESED BY:      [place], [place]:      [address]  [place] [address]    HPI: Jaziya is referred for evaluation of thrombocytosis and iron deficiency.    She reports no significant medical history. Recalls that she has had thrombocytosis dating back to a few years and even saw a hematologist a few years back. At that time, she was also noted to have thrombocytosis and had evaluation which showed a qualitative positive BCR ABL pcr , but confirmatory/ quantitative testing was negative.     On review of care everywhere, her plts were 300-500K ( 2016-2017). WBC normal, Hb normal. Ferritin was 152 in 08/2015, with iron sat of 29%.    She had labs checked recently which showed pts 540K and ferritin of 8. She was started on iron supplements ( has been taking it for about one month).  Takes iron supplement 2 pills once daily . Has not noticed constipation.    Periods are fairly light and short. Typically only 3 days. Never been pregnant    She is vegan since 2020, vegetarian for 17 food.     Never had iv iron in the past.     Donates blood- every 2-3 months.     PAST MEDICAL HISTORY:  Patient Active Problem List   Diagnosis    Iron deficiency       SOCIAL HISTORY:    Social History     Socioeconomic History    Marital status: Single   Tobacco Use    Smoking status: Never Smoker    Smokeless tobacco: Never Used   Substance and Sexual Activity    Alcohol use: Not Currently    Drug use: Never       FAMILY HISTORY:  No familly history of any hematologic disorders    MEDICATIONS  Current Outpatient Medications   Medication Sig    Cholecalciferol (VITAMIN D3 PO) Take one by mouth daily    Ferrous Sulfate (IRON PO) 18 mg. Take one by mouth 2 x daily      No current facility-administered medications for this visit.       ALLERGIES  Patient has no known allergies.    REVIEW OF SYSTEMS   GEN: The patient has no weight loss, fevers, or chills  EYES:No change in vision.  ENT: No change in hearing. No epistaxis.   PULM: No dyspnea, productive cough, or wheezing  CARDIO:  No chest pain, tachycardias, dyspnea on exertion.  GI: No jaundice, hematemesis, abdominal pain, diarrhea or constipation.  GU: No dysuria or hematuria.  SKIN: No rash   JOINT:  No new joint pains.  HEME/LYMPHATIC: No bleeding, anemias, or lymphadenopathy.  NEURO: No loss of consciousness, headaches.    PHYSICAL EXAMINATION:  BP 139/86 (BP Location: Left arm, BP Patient Position: Sitting, BP cuff size: Regular)    Pulse 76    Temp 97.7 F (36.5 C) (Temporal)    Resp 16    Ht '5\' 6"'  (1.676 m)    Wt 113.7 kg (250 lb 10.6 oz)    SpO2 98%    BMI 40.46 kg/m  GENERAL APPEARANCE: The patient is an alert, cooperative female in no acute distress.  SKIN: No lesions or rashes.  EYES: PERRLA, EOMs are intact. No icterus.   EAR, NOSE, AND THROAT: The orophynx is clear.   LYMPH NODES: There is no cervical, axillary, or inguinal adenopathy.  CHEST: The lungs are clear to auscultation.  HEART: S1 and S2 are normal.  There are no murmurs or extra sounds.  ABDOMEN:  The abdomen is soft and nontender.  There is no hepatosplenomegaly.  EXTREMITES: There is no edema or cyanosis. Joints are normal without redness or swelling.  NEURO:  Mental status is normal.  No focal neurologic findings.    Labs:    Lab Results   Component Value Date    BUN 9 03/14/2021    CREAT 0.56 03/14/2021    CL 107 03/14/2021    NA 140 03/14/2021    K 4.3 03/14/2021    CA 9.1 03/14/2021    BICARB 23 03/14/2021    GLU 91 03/14/2021     Lab Results   Component Value Date    AST 14 03/14/2021    ALT 10 03/14/2021    ALK 87 03/14/2021    TP 7.2 03/14/2021    ALB 4.4 03/14/2021    TBILI 0.24 03/14/2021     Lab Results   Component Value Date    WBC 8.9  03/14/2021    RBC 4.70 03/14/2021    HGB 11.1 (L) 03/14/2021    HCT 37.6 03/14/2021    MCV 80.0 03/14/2021    MCHC 29.5 (L) 03/14/2021    RDW 16.8 (H) 03/14/2021    PLT 532 (H) 03/14/2021    MPV 10.8 03/14/2021    LYMPHS 27 03/14/2021    MONOS 6 03/14/2021    EOS 1 03/14/2021    BASOS 1 03/14/2021     Lab Results   Component Value Date    FERRITIN 15 03/14/2021    IRON 17 (L) 03/14/2021    IRONSAT 5 03/14/2021    TIBC 332 03/14/2021    UIBC 315 03/14/2021       ASSESSMENT/PLAN:    Franny Selvage is a 35 year old female with no significant meddical history, referred to hematology for iron deficiency and thrombocytosis    1. Iron deficiency:  Likely dietary in nature since she is vegan. No history of significant bleeding. No obvious GI malabsorption. Discussed considering GI evaluation vs. repleting iron fully and then seeing if she drops sooner than anticipated. She is considering second strategy.     Continues to be iron deficiency despite oral repletion, will replete IV    2.) thrombocytosis: BCR ABL P210 PCR negative. Likely reactive in the setting of iron deficiency, will correct iron deficiency and trend plt count    Plan:    Labs today- call insruance to see which lab it would be covered    We will call you to schedule iron infusion    Return in 6 months or earlier as needed      Caralee Ates, MD  Hematology, [facility] ICD-9-CM   1. Thrombocytosis  D75.839 238.71   2. Iron deficiency  E61.1 280.9       CC:  Cooper Render

## 2021-03-16 ENCOUNTER — Telehealth (INDEPENDENT_AMBULATORY_CARE_PROVIDER_SITE_OTHER): Admitting: Family

## 2021-03-16 ENCOUNTER — Encounter (INDEPENDENT_AMBULATORY_CARE_PROVIDER_SITE_OTHER): Payer: Self-pay | Admitting: Family

## 2021-03-16 LAB — BCR-ABL QUANTITATIVE ANALYSIS, P210, BLOOD
BCR-ABL1 p210: NOT DETECTED
IS-NCN (%): 0
p210 Quant Ratio: 0

## 2021-03-16 NOTE — Progress Notes (Signed)
 [facility] Video Visit    Interactive real time audio and visual communication used for the telehealth visit.   HPI   Katrina Lam is a 35 year old female who presents to the clinic for COVID testing. Patient was exposed to someone with COVID-19 on Monday 03/12/21. She has no symptoms. She is vaccinated for COVID x 3. Is requesting testing. No other concerns.     REVIEW OF SYSTEMS    Please see HPI.     PAST MEDICAL AND SURGICAL HISTORY:    has no past surgical history on file.    History reviewed. No pertinent past medical history.     CURRENT MEDICATIONS:    Current Outpatient Medications:   .  Cholecalciferol (VITAMIN D3 PO), Take one by mouth daily, Disp: , Rfl:   .  Ferrous Sulfate (IRON PO), 18 mg. Take one by mouth 2 x daily, Disp: , Rfl:      ALLERGIES:  Patient has no known allergies.    FAMILY HISTORY:  family history is not on file.    PHYSICAL EXAMINATION    There were no vitals filed for this visit.  Blood Pressure   03/14/21 139/86     GEN: in NAD, A&O, pleasant, non-toxic appearing.  EENT: no injection or icterus.  PSYCH: affect appropriate.     ASSESSMENT AND PLAN    1. Close exposure to COVID-19 virus    - SARS-CoV-2, NAA - [facility]; Future  - Covid-19 Antigen (POCT)  - SARS-CoV-2, NAA - [facility]    Follow CDC guidelines.     Drive by testing scheduled. Discussed that false negatives do occur. If symptoms worsen and initial test is negative, will do a follow up visit for repeat testing. Quarantine until results are received and symptoms have resolved. If test is positive we will do a follow up visit to discuss how long to quarantine. Test will be received via mychart if negative. Go to the emergency department for dyspnea at rest. Patient understands and agrees.    FOLLOW UP   If things change.    Gerline Legacy, NP

## 2021-03-17 ENCOUNTER — Other Ambulatory Visit (INDEPENDENT_AMBULATORY_CARE_PROVIDER_SITE_OTHER)

## 2021-03-17 LAB — COVID-19 ANTIGEN POCT: COVID-19 Antigen (POCT): NOT DETECTED

## 2021-03-17 NOTE — Interdisciplinary (Signed)
 Verified patient with two identifiers. Collected PCR swab from patient and submitted to lab.  Patient instructed to isolate while waiting for test results. Swab performed Katrina Lam    Rapid Covid: Negative

## 2021-03-18 LAB — SARS-COV-2, NAA - LABCORP: COVID-19 Coronavirus PCR - LabCorp: NOT DETECTED

## 2021-03-18 LAB — SARS-COV-2, NAA 2 DAY TAT-LABCORP

## 2021-03-20 NOTE — Result Encounter Note (Signed)
 Please let patient know that their COVID-19 testing was negative.

## 2021-03-20 NOTE — Result Encounter Note (Signed)
 Call attempted, lvm for pt to return call

## 2021-03-21 ENCOUNTER — Encounter (HOSPITAL_BASED_OUTPATIENT_CLINIC_OR_DEPARTMENT_OTHER): Payer: Self-pay

## 2021-03-22 ENCOUNTER — Encounter (HOSPITAL_BASED_OUTPATIENT_CLINIC_OR_DEPARTMENT_OTHER): Payer: Self-pay | Admitting: Hematology & Oncology

## 2021-03-22 NOTE — Progress Notes (Signed)
Insurance refused injectafer and requires trial of venofer first.   Orders changed accordingly.    Burman Blacksmith, MD  Hematology, [facility]  [place] [facility] [place]

## 2021-04-08 ENCOUNTER — Other Ambulatory Visit: Payer: Self-pay

## 2021-04-09 ENCOUNTER — Ambulatory Visit
Admission: RE | Admit: 2021-04-09 | Discharge: 2021-04-12 | Disposition: A | Discharge status: Home / Self Care | Payer: Commercial Managed Care - PPO | Attending: Hematology & Oncology | Admitting: Hematology & Oncology

## 2021-04-09 VITALS — BP 130/75 | HR 67 | Temp 97.7°F | Resp 18 | Ht 66.0 in | Wt 250.2 lb

## 2021-04-09 DIAGNOSIS — E611 Iron deficiency: Secondary | ICD-10-CM | POA: Insufficient documentation

## 2021-04-09 MED ORDER — SODIUM CHLORIDE 0.9 % IV SOLN
200.0000 mg | Freq: Once | INTRAVENOUS | Status: AC
Start: 2021-04-09 — End: 2021-04-09
  Administered 2021-04-09 (×2): 200 mg via INTRAVENOUS
  Filled 2021-04-09: qty 10

## 2021-04-09 MED ORDER — SODIUM CHLORIDE 0.9 % IV SOLN
Freq: Once | INTRAVENOUS | Status: AC
Start: 2021-04-09 — End: 2021-04-09

## 2021-04-09 NOTE — Interdisciplinary (Signed)
Nursing Note - [facility]    35 year old female @ [facility] for C1 D1 of Venofer     Signs:  BP 130/75 (BP Location: Left arm, BP Patient Position: Sitting)   Pulse 67   Temp 97.7 F (36.5 C) (Temporal)   Resp 18   Ht 5\' 6"  (1.676 m)   Wt 113.5 kg (250 lb 3.6 oz)   SpO2 100%   BMI 40.39 kg/m      Pre-treatment nursing assessment:  No problems identified upon assessment.  ROS is negative for fever, chills, nausea, vomiting, shortness of breath, cough, or chest pain.     Access:  Right AC 24G    Treatment:  Patient tolerated infusion well and without complication. 15 minutes observation time done. PIV flushed, removed, and pressure dressing applied.    Treatment Education:   Information/teaching given to patient including: signs and symptoms of infection, bleeding, adverse reaction(s), symptom control, and when to notify MD.    Discharge Plan:  Return in 8 days (on 04/17/2021) for Non-Chemotherapy.  Discharge instructions given to patient.  Future appointments:date given and reviewed with treatment plan.  Discharge Mode: Ambulatory  Discharge Time: 1045  Accompanied by: Self  Discharged To: Home     Treatment Administration:  Medications   sodium chloride 0.9% infusion (0 mL IntraVENOUS Completed 04/09/21 1040)   iron sucrose (VENOFER) 200 mg in sodium chloride 0.9 % 100 mL IVPB (0 mg IntraVENOUS Completed 04/09/21 1023)

## 2021-04-16 ENCOUNTER — Other Ambulatory Visit: Payer: Self-pay

## 2021-04-17 ENCOUNTER — Ambulatory Visit
Admission: RE | Admit: 2021-04-17 | Discharge: 2021-04-20 | Disposition: A | Discharge status: Home / Self Care | Payer: Commercial Managed Care - PPO | Attending: Hematology & Oncology | Admitting: Hematology & Oncology

## 2021-04-17 VITALS — BP 133/85 | HR 71 | Temp 97.5°F | Resp 18 | Ht 66.0 in | Wt 251.5 lb

## 2021-04-17 DIAGNOSIS — E611 Iron deficiency: Secondary | ICD-10-CM | POA: Insufficient documentation

## 2021-04-17 MED ORDER — SODIUM CHLORIDE 0.9 % IV SOLN
Freq: Once | INTRAVENOUS | Status: AC
Start: 2021-04-17 — End: 2021-04-17

## 2021-04-17 MED ORDER — SODIUM CHLORIDE 0.9 % IV SOLN
200.0000 mg | Freq: Once | INTRAVENOUS | Status: AC
Start: 2021-04-17 — End: 2021-04-17
  Administered 2021-04-17: 200 mg via INTRAVENOUS
  Filled 2021-04-17: qty 10

## 2021-04-17 NOTE — Interdisciplinary (Addendum)
Non-Chemotherapy Infusion Nursing Note - [facility]    Katrina Lam is a 35 year old female who presents for infusion of iron sucrose    Vitals:    04/17/21 1454   BP: 133/85   BP Location: Left arm   BP Patient Position: Sitting   Pulse: 71   Resp: 18   Temp: 97.5 F (36.4 C)   TempSrc: Tympanic   SpO2: 99%   Weight: 114.1 kg (251 lb 8.7 oz)   Height: 5\' 6"  (1.676 m)     Pain Score: 0  Body surface area is 2.31 meters squared.  Body mass index is 40.6 kg/m.    Pre-treatment nursing assessment:  No problems identified upon assessment.  PIV inserted 2nd attempt 24 to RAC.  Denies previous allergic reaction to infusion of iron.  Future appts requested.     tolerated treatment well.    Post blood return: Brisk  Post-Flush: NS    Medications   iron sucrose (VENOFER) 200 mg in sodium chloride 0.9 % 100 mL IVPB (200 mg IntraVENOUS New Bag 04/17/21 1459)   sodium chloride 0.9% infusion ( IntraVENOUS New Bag 04/17/21 1452)     Discharge Plan    Discharge Mode: Ambulatory  Accompanied by: Self  Discharged To: Home

## 2021-04-23 ENCOUNTER — Other Ambulatory Visit: Payer: Self-pay

## 2021-04-24 ENCOUNTER — Ambulatory Visit
Admission: RE | Admit: 2021-04-24 | Discharge: 2021-04-24 | Disposition: A | Discharge status: Home / Self Care | Payer: Commercial Managed Care - PPO | Attending: Hematology & Oncology | Admitting: Hematology & Oncology

## 2021-04-24 VITALS — BP 124/73 | HR 74 | Temp 98.6°F | Resp 16 | Ht 66.0 in | Wt 252.5 lb

## 2021-04-24 DIAGNOSIS — E611 Iron deficiency: Secondary | ICD-10-CM | POA: Insufficient documentation

## 2021-04-24 MED ORDER — SODIUM CHLORIDE 0.9 % IV SOLN
200.0000 mg | Freq: Once | INTRAVENOUS | Status: AC
Start: 2021-04-24 — End: 2021-04-24
  Administered 2021-04-24: 200 mg via INTRAVENOUS
  Filled 2021-04-24: qty 10

## 2021-04-24 MED ORDER — SODIUM CHLORIDE 0.9 % IV SOLN
Freq: Once | INTRAVENOUS | Status: AC
Start: 2021-04-24 — End: 2021-04-24

## 2021-04-24 NOTE — Interdisciplinary (Signed)
Non-Chemotherapy Infusion Nursing Note - [facility]    Katrina Lam is a 35 year old female who presents for infusion of Iron: Venofer 200 mg      ICD-10-CM ICD-9-CM   1. Iron deficiency  E61.1 280.9       Vitals:    04/24/21 1513 04/24/21 1552   BP: 135/78 124/73   BP Location: Left arm Left arm   BP Patient Position: Sitting Sitting   Pulse: 68 74   Resp: 16 16   Temp: 98.6 F (37 C) 98.6 F (37 C)   TempSrc: Temporal Temporal   SpO2: 99%    Weight: 114.5 kg (252 lb 8.6 oz)    Height: 5\' 6"  (1.676 m)      Pain Score: 0  Body surface area is 2.31 meters squared.  Body mass index is 40.76 kg/m.    Pre-treatment nursing assessment:  No problems identified upon assessment.  Alert, oriented and cooperative.  Ambulatory with steady gait.  Vital signs stable and in no apparent distress.  Pt reports no issues or concerns at this time. Per pt tolerated prior iron infusion without problems.  Pt denies SOB, cough and no fever or chills.  PIV with brisk blood return and flushed well with saline.    Medications   sodium chloride 0.9% infusion (0 mL IntraVENOUS Stopped 04/24/21 1553)   iron sucrose (VENOFER) 200 mg in sodium chloride 0.9 % 100 mL IVPB (0 mg IntraVENOUS Completed 04/24/21 1546)       04/26/21 tolerated treatment well. VSS.   Post blood return: Brisk  Post-Flush: NS and Discontinued IV    Patient Education  Learner: Patient  Barriers to learning: No Barriers  Readiness to learn: Acceptance  Method: Explanation    Treatment Education: Information/teaching given to patient including: signs and symptoms of infection, bleeding, adverse reaction(s), symptom control, and when to notify MD.    Tawny Hopping Prevention Education: Instructed patient to call for assistance.    Pain Education: Patient instructed to contact nurse if pain should develop or if their current pain therapy becomes ineffective.    Response: Verbalizes understanding    Discharge Plan  Discharge instructions given to patient.  Future  appointments given and reviewed with treatment plan.  Discharge Mode: Ambulatory  Accompanied by: Self  Discharged To: Home

## 2021-05-18 ENCOUNTER — Telehealth (HOSPITAL_BASED_OUTPATIENT_CLINIC_OR_DEPARTMENT_OTHER): Payer: Self-pay

## 2021-05-18 ENCOUNTER — Other Ambulatory Visit: Payer: Self-pay

## 2021-05-18 NOTE — Telephone Encounter (Signed)
Dr. Artelia Laroche,    Katrina Lam is scheduled for Infusion of Iron on 05/22/2021.     Please review the treatment plan and change the date to reflect patient's scheduled appointment on 05/22/2021.    This message is for notification purposes. Patients with unsigned orders will not have their Infusion visits rescheduled at this time.     For further questions, please respond to the RN Liaison pool at [facility] INFUSION TRIAGE     Thank you,   Channing Mutters, RN

## 2021-05-22 ENCOUNTER — Ambulatory Visit
Admission: RE | Admit: 2021-05-22 | Discharge: 2021-05-22 | Disposition: A | Discharge status: Home / Self Care | Payer: Commercial Managed Care - PPO | Attending: Hematology & Oncology | Admitting: Hematology & Oncology

## 2021-05-22 VITALS — BP 127/78 | HR 65 | Temp 97.5°F | Resp 17 | Ht 66.0 in | Wt 250.2 lb

## 2021-05-22 DIAGNOSIS — E611 Iron deficiency: Secondary | ICD-10-CM

## 2021-05-22 MED ORDER — SODIUM CHLORIDE 0.9 % IV SOLN
Freq: Once | INTRAVENOUS | Status: AC
Start: 2021-05-22 — End: 2021-05-22

## 2021-05-22 MED ORDER — SODIUM CHLORIDE 0.9 % IV SOLN
200.0000 mg | Freq: Once | INTRAVENOUS | Status: AC
Start: 2021-05-22 — End: 2021-05-22
  Administered 2021-05-22 (×2): 200 mg via INTRAVENOUS
  Filled 2021-05-22: qty 10

## 2021-05-22 NOTE — Interdisciplinary (Signed)
Nursing Note - [facility]  35 year old female @ [facility] for  Iron sucrose    ICD-10-CM ICD-9-CM   1. Iron deficiency  E61.1 280.9     Chief Complaint:  Infusion Non Chemotherapy and Line Care    Vital Signs:  BP 127/78 (BP Location: Left arm, BP Patient Position: Sitting)   Pulse 65   Temp 97.5 F (36.4 C) (Temporal)   Resp 17   Ht 5\' 6"  (1.676 m)   Wt 113.5 kg (250 lb 3.6 oz)   SpO2 98%   BMI 40.39 kg/m   Pre-treatment nursing assessment:  No problems identified upon assessment.  ROS is negative for fever, chills, nausea, vomiting, shortness of breath, cough, or chest pain.   Access:  Right forearm PIV  Treatment:  Patient tolerated infusion well and without complication.   PIV flushed, removed, and pressure dressing applied     Discharge Plan:  Return in 1 week (on 05/29/2021) for nonchemotherapy infusion, line care.  Discharge instructions given to patient.  Future appointments:date given and reviewed with treatment plan.  Discharge Mode: Ambulatory  Discharge Time: 1640  Accompanied by: Self  Discharged To: Home   Treatment Administration:  Medications   sodium chloride 0.9% infusion (0 mL IntraVENOUS Stopped 05/22/21 1635)   iron sucrose (VENOFER) 200 mg in sodium chloride 0.9 % 100 mL IVPB (0 mg IntraVENOUS Completed 05/22/21 1626)

## 2021-05-24 ENCOUNTER — Other Ambulatory Visit: Payer: Self-pay

## 2021-05-29 ENCOUNTER — Ambulatory Visit
Admission: RE | Admit: 2021-05-29 | Discharge: 2021-05-29 | Disposition: A | Discharge status: Home / Self Care | Payer: Commercial Managed Care - PPO | Attending: Hematology & Oncology | Admitting: Hematology & Oncology

## 2021-05-29 VITALS — BP 126/75 | HR 64 | Temp 97.7°F | Resp 16 | Ht 65.98 in | Wt 249.1 lb

## 2021-05-29 DIAGNOSIS — E611 Iron deficiency: Secondary | ICD-10-CM | POA: Insufficient documentation

## 2021-05-29 MED ORDER — SODIUM CHLORIDE 0.9 % IV SOLN
Freq: Once | INTRAVENOUS | Status: AC
Start: 2021-05-29 — End: 2021-05-29

## 2021-05-29 MED ORDER — SODIUM CHLORIDE 0.9 % IV SOLN
200.0000 mg | Freq: Once | INTRAVENOUS | Status: AC
Start: 2021-05-29 — End: 2021-05-29
  Administered 2021-05-29 (×2): 200 mg via INTRAVENOUS
  Filled 2021-05-29: qty 10

## 2021-05-29 NOTE — Interdisciplinary (Signed)
Non-Chemotherapy Infusion Nursing Note - [facility]    Katrina Lam is a 35 year old female who presents for infusion of Iron .    Vitals:    05/29/21 1516   BP: 126/75   BP Location: Right arm   BP Patient Position: Sitting   Pulse: 64   Resp: 16   Temp: 97.7 F (36.5 C)   TempSrc: Temporal   SpO2: 99%   Weight: 113 kg (249 lb 1.9 oz)   Height: 5' 5.98" (1.676 m)     Pain Score: 0  Body surface area is 2.29 meters squared.  Body mass index is 40.23 kg/m.    Pre-treatment nursing assessment:  Presents to IC unaccompanied.  A&O. Ambulates ad lib with a steady gait .  Denies fever, chills, and cough .  24g placed to right arm x1 attempt, blood return present .    iron sucrose (VENOFER) 200 mg in sodium chloride 0.9 % infused over .  Patient tolerated infusion without any sort of reaction.  Marland Kitchen    Post blood return: Brisk  Post-Flush: NS /2ml     Iv dc'd, site without erythema or induration.    Discharged to home in no apparent distress .

## 2021-06-18 ENCOUNTER — Ambulatory Visit (HOSPITAL_BASED_OUTPATIENT_CLINIC_OR_DEPARTMENT_OTHER): Payer: Commercial Managed Care - PPO | Admitting: Hematology & Oncology

## 2021-08-20 ENCOUNTER — Other Ambulatory Visit: Payer: Self-pay

## 2021-08-21 ENCOUNTER — Encounter (HOSPITAL_BASED_OUTPATIENT_CLINIC_OR_DEPARTMENT_OTHER): Payer: Self-pay | Admitting: Hematology & Oncology

## 2021-08-21 ENCOUNTER — Ambulatory Visit: Payer: Commercial Managed Care - PPO | Attending: Hematology & Oncology | Admitting: Hematology & Oncology

## 2021-08-21 ENCOUNTER — Encounter (HOSPITAL_BASED_OUTPATIENT_CLINIC_OR_DEPARTMENT_OTHER): Payer: Self-pay

## 2021-08-21 VITALS — BP 143/87 | HR 68 | Temp 97.8°F | Resp 16 | Ht 65.98 in | Wt 255.3 lb

## 2021-08-21 DIAGNOSIS — D75839 Thrombocytosis, unspecified: Secondary | ICD-10-CM | POA: Insufficient documentation

## 2021-08-21 DIAGNOSIS — E611 Iron deficiency: Secondary | ICD-10-CM | POA: Insufficient documentation

## 2021-08-21 NOTE — Goals of Care (Signed)
Advance Care Planning    What gives the patient's life meaning?  "Family, friends, travel"      Who would make medical decisions for the patient if they are unable to make decisions for themselves?   "Dawn Ratledge--mother  Christian Purcell--partner"    Based on above information I recommended the following:  Pt brought PREPARE WS to clinic; uploaded; copy sent ot email per pt request

## 2021-08-21 NOTE — Progress Notes (Signed)
Hematology Consultation        Demographics:  Date: August 21, 2021   Patient Name: Katrina Lam   Medical Record #: [id number]   DOB: 21-Jul-1986   Age: 35 year old  Sex: female      REQUESED BY:      [place], [place]:      [place]  [address] [place] [address]    Interval history:    Seen today for follow up of her iron deficiency anemia.   S/p 5 doses of venofer 200 mg IV ( 7/4, 7/12, 7/19, 8/16, 05/29/21).     Felt somewhat better after the infusion, would like to recheck labs.     HPI:   Katrina Lam is referred for evaluation of thrombocytosis and iron deficiency.    She reports no significant medical history. Recalls that she has had thrombocytosis dating back to a few years and even saw a hematologist a few years back. At that time, she was also noted to have thrombocytosis and had evaluation which showed a qualitative positive BCR ABL pcr , but confirmatory/ quantitative testing was negative.     On review of care everywhere, her plts were 300-500K ( 2016-2017). WBC normal, Hb normal. Ferritin was 152 in 08/2015, with iron sat of 29%.    She had labs checked recently which showed pts 540K and ferritin of 8. She was started on iron supplements ( has been taking it for about one month).  Takes iron supplement 2 pills once daily . Has not noticed constipation.    Periods are fairly light and short. Typically only 3 days. Never been pregnant    She is vegan since 2020, vegetarian for 17 food.     Never had iv iron in the past.     Donates blood- every 2-3 months.     PAST MEDICAL HISTORY:  Patient Active Problem List   Diagnosis    Iron deficiency       SOCIAL HISTORY:    Social History     Socioeconomic History    Marital status: Single   Tobacco Use    Smoking status: Never    Smokeless tobacco: Never   Substance and Sexual Activity    Alcohol use: Not Currently    Drug use: Never       FAMILY HISTORY:  No familly  history of any hematologic disorders    MEDICATIONS  Current Outpatient Medications   Medication Sig    Cholecalciferol (VITAMIN D3 PO) Take one by mouth daily    Ferrous Sulfate (IRON PO) 18 mg. Take one by mouth 2 x daily     No current facility-administered medications for this visit.       ALLERGIES  Patient has no known allergies.    REVIEW OF SYSTEMS   GEN: The patient has no weight loss, fevers, or chills  EYES:No change in vision.  ENT: No change in hearing. No epistaxis.   PULM: No dyspnea, productive cough, or wheezing  CARDIO:  No chest pain, tachycardias, dyspnea on exertion.  GI: No jaundice, hematemesis, abdominal pain, diarrhea or constipation.  GU: No dysuria or hematuria.  SKIN: No rash   JOINT:  No new joint pains.  HEME/LYMPHATIC: No bleeding, anemias, or lymphadenopathy.  NEURO: No loss of consciousness, headaches.    PHYSICAL EXAMINATION:  BP 143/87 (BP Location: Left arm, BP Patient Position: Sitting, BP cuff  size: Large)    Pulse 68    Temp 97.8 F (36.6 C) (Temporal)    Resp 16    Ht 5' 5.98" (1.676 m)    Wt 115.8 kg (255 lb 4.7 oz)    SpO2 98%    BMI 41.23 kg/m   GENERAL APPEARANCE: The patient is an alert, cooperative female in no acute distress.  SKIN: No lesions or rashes.  EYES:  No icterus.   EAR, NOSE, AND THROAT: The orophynx is clear.   LYMPH NODES: There is no cervical, axillary, or inguinal adenopathy.  CHEST: The lungs are clear to auscultation.  HEART: S1 and S2 are normal.  There are no murmurs or extra sounds.  ABDOMEN:  The abdomen is soft and nontender.  There is no hepatosplenomegaly.  EXTREMITES: There is no edema or cyanosis. Joints are normal without redness or swelling.  NEURO:  Mental status is normal.  No focal neurologic findings.    Labs:    Lab Results   Component Value Date    BUN 9 03/14/2021    CREAT 0.56 03/14/2021    CL 107 03/14/2021    NA 140 03/14/2021    K 4.3 03/14/2021    [place] 9.1 03/14/2021    BICARB 23 03/14/2021    GLU 91 03/14/2021     Lab Results    Component Value Date    AST 14 03/14/2021    ALT 10 03/14/2021    ALK 87 03/14/2021    TP 7.2 03/14/2021    ALB 4.4 03/14/2021    TBILI 0.24 03/14/2021     Lab Results   Component Value Date    WBC 8.9 03/14/2021    RBC 4.70 03/14/2021    HGB 11.1 (L) 03/14/2021    HCT 37.6 03/14/2021    MCV 80.0 03/14/2021    MCHC 29.5 (L) 03/14/2021    RDW 16.8 (H) 03/14/2021    PLT 532 (H) 03/14/2021    MPV 10.8 03/14/2021    LYMPHS 27 03/14/2021    MONOS 6 03/14/2021    EOS 1 03/14/2021    BASOS 1 03/14/2021     Lab Results   Component Value Date    FERRITIN 15 03/14/2021    IRON 17 (L) 03/14/2021    IRONSAT 5 03/14/2021    TIBC 332 03/14/2021    UIBC 315 03/14/2021     03/14/21:  BCR ABL PCR 210- negative     ASSESSMENT/PLAN:    [name] is a 35 year old female with no significant meddical history, referred to hematology for iron deficiency and thrombocytosis    1. Iron deficiency:  Likely dietary in nature since she is vegan. No history of significant bleeding. No obvious GI malabsorption. Discussed considering GI evaluation vs. repleting iron fully and then seeing if she drops sooner than anticipated. She is considering second strategy.     - will repeat labs today and plan accordingly    2.) thrombocytosis: BCR ABL P210 PCR negative. Likely reactive in the setting of iron deficiency, trend plt count    Plan:     Labs today- call insruance to see which lab it would be covered  Repeat labs in 6 months and return to hematology prn      Caralee Ates, MD  Hematology, [facility]       ICD-10-CM ICD-9-CM   1. Iron deficiency  E61.1 280.9   2. Thrombocytosis  D75.839 238.71  CC:  Cooper Render

## 2021-08-21 NOTE — Patient Instructions (Addendum)
PATIENT INSTRUCTIONS:     1.) Please connect with Doran Heater for financial services and possible review of your bill.    Financial Counselors:    De Hollingshead  Phone: [phone]  Fax [phone]             2.) We have printed the labs for you to please take to best lab indicated by your insurance.    3.) Please connect with [place] via MyChart in 6 months.      Physician:             Burman Blacksmith, MD  Physician Assistant: Althia Forts, PA-C    [facility]  [address]  [place], [place] [address]  Phone: [phone]  Fax: [phone]    [facility]   [address]  [place], [place] [address]  Hours: 08:00am - 5:00pm  Phone: [phone]                    Phone List for Patients    Hours of Operation:  Monday-Friday 8:00- 5:00p.m. (Closed Holidays and Weekends)         AFTER HOURS EMERGENCY NUMBER    [phone]) [phone]     **Ask for the Hematology/Oncology Physician On-Call.                                                                                Physician: Burman Blacksmith, MD  Nurse Practitioner:  Loraine Maple, NP    Administrative Assistant:  Beckie Salts  Phone:    [phone] Fax: [phone]  **Forms and letters take 3-5 business days to prepare**    Nurse Case Manager:  Matt Holmes   Phone:   [phone] Fax: [phone]  **Please note we need at least 72 hours notice for any medication refills.     Social Services:     Henri Medal, LCSW   [facility]  Phone: [phone]        Percival Spanish, LCSW   [place]  Phone: [phone]                 Information Desk:  [phone]  ** General information: directions, telephone numbers, or available services.    Cancer Center Clinic:  [phone]  **Call to schedule, cancel, or reschedule clinic appointments.    Infusion Center:    [phone] [facility])     [phone]  [facility])    Hours: Monday-Friday  7:30am-7:30pm; Sat-Sun-Holidays   8:00am-6:00pm-  ** Call to schedule, cancel, or reschedule  appointments    Financial Counselors:    De Hollingshead  Phone: [phone]  Fax [phone]                                           [facility]:  [phone]     Open Monday-Friday 9:00am -5:00pm, closed Holidays and Weekends    [facility]:  [phone]    Open Monday-Friday  8:00am -5:00pm, closed Holidays and Weekends  Registration (to update personal [email or web address])[phone]  Doctor Referral Line    [phone]  Medical Records    [phone]  Radiology ([place])  [phone]  Interventional Radiology                             [phone]   Radiation Oncology                                     [phone]    [place] [facility] [place]:  [phone]      Follow-up care is a key part of your treatment and safety. Be sure to make and go to all appointments, and call us if you are having problems. It's also a good idea to know your test results and keep a list of the medicines you take and bring them to each appointment  -  Our Mission is to deliver outstanding patient care through commitment to the community, groundbreaking research and inspired teaching.     Our Vision is to create a healthier world ~ one life at a time ~ through new science, new medicine and new cures.     [facility]   [address], [place] [address]

## 2021-09-05 ENCOUNTER — Other Ambulatory Visit (INDEPENDENT_AMBULATORY_CARE_PROVIDER_SITE_OTHER): Payer: Self-pay | Admitting: Hematology & Oncology

## 2021-09-06 LAB — CBC/DIFF AMBIGUOUS DEFAULT-LABCORP
Baso (Absolute): 0.1 10*3/uL (ref 0.0–0.2)
Basos: 1 %
Eos (Absolute): 0.1 10*3/uL (ref 0.0–0.4)
Eos: 1 %
Hematocrit: 40.3 % (ref 34.0–46.6)
Hemoglobin: 13.3 g/dL (ref 11.1–15.9)
Immature Grans (Abs): 0 10*3/uL (ref 0.0–0.1)
Immature Granulocytes: 0 %
Lymphs (Absolute): 3 10*3/uL (ref 0.7–3.1)
Lymphs: 27 %
MCH: 29.6 pg (ref 26.6–33.0)
MCHC: 33 g/dL (ref 31.5–35.7)
MCV: 90 fL (ref 79–97)
Monocytes(Absolute): 0.7 10*3/uL (ref 0.1–0.9)
Monocytes: 6 %
Neutrophils (Absolute): 7.3 10*3/uL — ABNORMAL HIGH (ref 1.4–7.0)
Neutrophils: 65 %
Platelets: 467 10*3/uL — ABNORMAL HIGH (ref 150–450)
RBC: 4.5 x10E6/uL (ref 3.77–5.28)
RDW: 12.7 % (ref 11.7–15.4)
WBC: 11.2 10*3/uL — ABNORMAL HIGH (ref 3.4–10.8)

## 2021-09-06 LAB — IRON/IBC PANEL
Iron Binding Capacity (TIBC): 291 ug/dL (ref 250–450)
Iron Saturation: 15 % (ref 15–55)
Iron, Serum: 44 ug/dL (ref 27–159)
UIBC: 247 ug/dL (ref 131–425)

## 2021-09-06 LAB — FERRITIN, BLOOD: Ferritin: 80 ng/mL (ref 15–150)

## 2021-09-11 ENCOUNTER — Ambulatory Visit (HOSPITAL_BASED_OUTPATIENT_CLINIC_OR_DEPARTMENT_OTHER): Payer: Commercial Managed Care - PPO | Admitting: Hematology & Oncology

## 2022-05-22 DIAGNOSIS — N200 Calculus of kidney: Secondary | ICD-10-CM | POA: Diagnosis not present

## 2022-05-22 DIAGNOSIS — I10 Essential (primary) hypertension: Secondary | ICD-10-CM | POA: Diagnosis not present

## 2022-05-22 DIAGNOSIS — Z0000 Encounter for general adult medical examination without abnormal findings: Secondary | ICD-10-CM | POA: Diagnosis not present

## 2022-05-22 DIAGNOSIS — G43909 Migraine, unspecified, not intractable, without status migrainosus: Secondary | ICD-10-CM | POA: Diagnosis not present

## 2022-06-25 DIAGNOSIS — E782 Mixed hyperlipidemia: Secondary | ICD-10-CM | POA: Diagnosis not present

## 2022-07-16 DIAGNOSIS — D485 Neoplasm of uncertain behavior of skin: Secondary | ICD-10-CM | POA: Diagnosis not present

## 2022-07-17 DIAGNOSIS — N200 Calculus of kidney: Secondary | ICD-10-CM | POA: Diagnosis not present

## 2022-07-22 DIAGNOSIS — B078 Other viral warts: Secondary | ICD-10-CM | POA: Diagnosis not present

## 2022-09-04 DIAGNOSIS — O1420 HELLP syndrome (HELLP), unspecified trimester: Secondary | ICD-10-CM | POA: Diagnosis not present

## 2022-09-04 DIAGNOSIS — E785 Hyperlipidemia, unspecified: Secondary | ICD-10-CM | POA: Diagnosis not present

## 2022-09-04 DIAGNOSIS — I10 Essential (primary) hypertension: Secondary | ICD-10-CM | POA: Diagnosis not present

## 2022-09-04 DIAGNOSIS — Z841 Family history of disorders of kidney and ureter: Secondary | ICD-10-CM | POA: Diagnosis not present

## 2022-10-08 DIAGNOSIS — K5900 Constipation, unspecified: Secondary | ICD-10-CM | POA: Diagnosis not present

## 2022-10-08 DIAGNOSIS — G2581 Restless legs syndrome: Secondary | ICD-10-CM | POA: Diagnosis not present

## 2022-10-17 DIAGNOSIS — Z841 Family history of disorders of kidney and ureter: Secondary | ICD-10-CM | POA: Diagnosis not present

## 2022-10-17 DIAGNOSIS — N200 Calculus of kidney: Secondary | ICD-10-CM | POA: Diagnosis not present

## 2022-10-17 DIAGNOSIS — E785 Hyperlipidemia, unspecified: Secondary | ICD-10-CM | POA: Diagnosis not present

## 2022-10-17 DIAGNOSIS — I10 Essential (primary) hypertension: Secondary | ICD-10-CM | POA: Diagnosis not present

## 2022-11-05 DIAGNOSIS — E782 Mixed hyperlipidemia: Secondary | ICD-10-CM | POA: Diagnosis not present

## 2022-11-05 DIAGNOSIS — K581 Irritable bowel syndrome with constipation: Secondary | ICD-10-CM | POA: Diagnosis not present

## 2022-11-05 DIAGNOSIS — I10 Essential (primary) hypertension: Secondary | ICD-10-CM | POA: Diagnosis not present

## 2022-11-05 DIAGNOSIS — R140 Abdominal distension (gaseous): Secondary | ICD-10-CM | POA: Diagnosis not present

## 2022-11-05 DIAGNOSIS — R109 Unspecified abdominal pain: Secondary | ICD-10-CM | POA: Diagnosis not present

## 2022-11-07 DIAGNOSIS — N764 Abscess of vulva: Secondary | ICD-10-CM | POA: Diagnosis not present

## 2022-11-15 DIAGNOSIS — N9089 Other specified noninflammatory disorders of vulva and perineum: Secondary | ICD-10-CM | POA: Diagnosis not present

## 2022-11-18 ENCOUNTER — Encounter (HOSPITAL_BASED_OUTPATIENT_CLINIC_OR_DEPARTMENT_OTHER): Payer: Self-pay | Admitting: Hematology & Oncology

## 2022-11-18 DIAGNOSIS — E611 Iron deficiency: Secondary | ICD-10-CM

## 2022-11-27 LAB — CBC WITH DIFF, BLOOD
Abs Basophils: 68 cells/uL (ref 0–200)
Abs Eosinophils: 58 cells/uL (ref 15–500)
Abs Lymphs: 2406 cells/uL (ref 850–3900)
Abs Monocytes: 592 cells/uL (ref 200–950)
Abs Neutrophils: 6577 cells/uL (ref 1500–7800)
Basophils: 0.7 %
Eosinophils: 0.6 %
HCT: 38.3 % (ref 35.0–45.0)
HGB: 12.8 g/dL (ref 11.7–15.5)
Lymps: 24.8 %
MCH: 29.1 pg (ref 27.0–33.0)
MCHC: 33.4 g/dL (ref 32.0–36.0)
MCV: 87 fL (ref 80.0–100.0)
MPV: 10.8 fL (ref 7.5–12.5)
Monocytes: 6.1 %
PLT: 483 10*3/uL — ABNORMAL HIGH (ref 140–400)
RBC: 4.4 10*6/uL (ref 3.80–5.10)
RDW: 12.8 % (ref 11.0–15.0)
SEGS: 67.8 %
WBC: 9.7 10*3/uL (ref 3.8–10.8)

## 2022-11-27 LAB — COMPREHENSIVE METABOLIC PANEL, BLOOD
ALT (SGPT): 11 U/L (ref 6–29)
AST (SGOT): 17 U/L (ref 10–30)
Albumin/Glob Ratio: 1.5 (calc) (ref 1.0–2.5)
Albumin: 4.3 g/dL (ref 3.6–5.1)
Alkaline Phos: 78 U/L (ref 31–125)
BUN/Creatinine Ratio: 12 (calc) (ref 6–22)
BUN: 6 mg/dL — ABNORMAL LOW (ref 7–25)
Bilirubin, Total: 0.6 mg/dL (ref 0.2–1.2)
Calcium: 9.2 mg/dL (ref 8.6–10.2)
Carbon Dioxide: 26 mmol/L (ref 20–32)
Chloride: 102 mmol/L (ref 98–110)
Creatinine: 0.51 mg/dL (ref 0.50–0.97)
EGFR: 125 mL/min/{1.73_m2} (ref >=60)
Globulin: 2.9 g/dL (calc) (ref 1.9–3.7)
Glucose: 84 mg/dL (ref 65–99)
Potassium: 4.2 mmol/L (ref 3.5–5.3)
Sodium: 138 mmol/L (ref 135–146)
Total Protein: 7.2 g/dL (ref 6.1–8.1)

## 2022-11-27 LAB — IRON, BLOOD: Iron: 70 ug/dL (ref 40–190)

## 2022-11-27 LAB — FERRITIN, BLOOD: Ferritin: 33 ng/mL (ref 16–154)

## 2022-12-05 DIAGNOSIS — E782 Mixed hyperlipidemia: Secondary | ICD-10-CM | POA: Diagnosis not present

## 2022-12-05 DIAGNOSIS — K5904 Chronic idiopathic constipation: Secondary | ICD-10-CM | POA: Diagnosis not present

## 2022-12-05 DIAGNOSIS — I10 Essential (primary) hypertension: Secondary | ICD-10-CM | POA: Diagnosis not present

## 2022-12-05 DIAGNOSIS — Z8379 Family history of other diseases of the digestive system: Secondary | ICD-10-CM | POA: Diagnosis not present

## 2022-12-11 DIAGNOSIS — J029 Acute pharyngitis, unspecified: Secondary | ICD-10-CM | POA: Diagnosis not present

## 2022-12-11 DIAGNOSIS — R52 Pain, unspecified: Secondary | ICD-10-CM | POA: Diagnosis not present

## 2022-12-11 DIAGNOSIS — R6889 Other general symptoms and signs: Secondary | ICD-10-CM | POA: Diagnosis not present

## 2022-12-11 DIAGNOSIS — R509 Fever, unspecified: Secondary | ICD-10-CM | POA: Diagnosis not present

## 2023-01-09 DIAGNOSIS — R140 Abdominal distension (gaseous): Secondary | ICD-10-CM | POA: Diagnosis not present

## 2023-01-09 DIAGNOSIS — I10 Essential (primary) hypertension: Secondary | ICD-10-CM | POA: Diagnosis not present

## 2023-01-09 DIAGNOSIS — Z8379 Family history of other diseases of the digestive system: Secondary | ICD-10-CM | POA: Diagnosis not present

## 2023-01-09 DIAGNOSIS — K581 Irritable bowel syndrome with constipation: Secondary | ICD-10-CM | POA: Diagnosis not present

## 2023-01-13 DIAGNOSIS — N9089 Other specified noninflammatory disorders of vulva and perineum: Secondary | ICD-10-CM | POA: Diagnosis not present

## 2023-01-13 DIAGNOSIS — N907 Vulvar cyst: Secondary | ICD-10-CM | POA: Diagnosis not present

## 2023-01-16 ENCOUNTER — Encounter (INDEPENDENT_AMBULATORY_CARE_PROVIDER_SITE_OTHER): Payer: Self-pay | Admitting: Family Practice

## 2023-01-16 ENCOUNTER — Ambulatory Visit (INDEPENDENT_AMBULATORY_CARE_PROVIDER_SITE_OTHER): Admitting: Family Practice

## 2023-01-16 VITALS — BP 142/85 | HR 65 | Temp 97.5°F | Ht 66.0 in | Wt 254.2 lb

## 2023-01-16 MED ORDER — NAPROXEN 500 MG OR TABS
ORAL_TABLET | ORAL | 0 refills | Status: AC
Start: 2023-01-16 — End: 2023-02-13

## 2023-01-16 NOTE — Progress Notes (Signed)
[facility] Visit    This note was created with assistance with HIPAA compliant AI scribe technology with the patient's verbal consent.  HPI   Katrina Lam is a 37 year old female who presents to clinic for Pain (Shooting pain in the back of leg, causing difficulty to walk and move from standing to sitting /Sx's started yesterday after yoga)      The patient presents with a chief complaint of pain in the back of their leg, extending from the hip and lower back down to the leg. The pain began during a yoga session while moving the leg from the front to the back. The patient has no history of previous back problems, surgeries, or injuries. The pain is exacerbated by movement, walking, laying down, and transitioning from standing to sitting. The patient rates the pain as a 5 on a scale of 1 to 10.    The patient has not taken any pain medications for the current issue but did apply ice to the affected area. The patient is currently taking vitamin D and prenatal vitamins, with no history of chronic steroid use.   PHQ:      01/16/2023     9:02 AM   FM PHQ2   PHQ2 Total 0        REVIEW OF SYSTEMS    Review of Systems  Negative except for those in HPI      PAST MEDICAL AND SURGICAL HISTORY:    has no past surgical history on file.  Patient Active Problem List   Diagnosis    Iron deficiency        CURRENT MEDICATIONS:    Current Outpatient Medications:     Cholecalciferol (VITAMIN D3 PO), Take one by mouth daily, Disp: , Rfl:     naproxen (NAPROSYN) 500 MG tablet, Take 1 tablet (500 mg) by mouth 2 times daily (with meals) for 7 days, THEN 1 tablet (500 mg) 2 times daily as needed (Pain) for up to 21 days. Take with meals.., Disp: 56 tablet, Rfl: 0     ALLERGIES:  Patient has no known allergies.    FAMILY HISTORY:  family history is not on file.    SOCIAL HISTORY:     Social History     Socioeconomic History    Marital status: Single   Tobacco Use    Smoking status: Never    Smokeless tobacco: Never   Substance and  Sexual Activity    Alcohol use: Yes     Comment: casual    Drug use: Never        PHYSICAL EXAMINATION       01/16/23  1145 01/16/23  1233   BP: 147/90 142/85   Pulse: 65    Temp: 97.5 F (36.4 C)    SpO2: 98%      Body mass index is 41.03 kg/m.  Wt Readings from Last 5 Encounters:   01/16/23 115.3 kg (254 lb 3.2 oz)   08/21/21 115.8 kg (255 lb 4.7 oz)   05/29/21 113 kg (249 lb 1.9 oz)   05/22/21 113.5 kg (250 lb 3.6 oz)   04/24/21 114.5 kg (252 lb 8.6 oz)     Blood Pressure   01/16/23 142/85   08/21/21 143/87   05/29/21 126/75   05/22/21 127/78   04/24/21 124/73     Physical Exam  GEN: in NAD, A&O, pleasant, non-toxic appearing  EENT: No injection or icterus.   Neurologic: Mentation appropriate. No facial droop.  No tremor.  Psych: Alert and Oriented. Affect appropriate.  Skin: exposed areas free of rash and no significant lesions  Back: No obvious spinal deformity or bony tenderness. 5/5 strength observed, with increased pain on palpation on right side.    ASSESSMENT AND PLAN        [name] was seen today for pain.    Diagnoses and all orders for this visit:    Sciatica, unspecified laterality  Prescribe naproxen (Aleve) 500 mg twice daily with food for the first 5-7 days, then as needed for pain relief.    b. Recommend heat or ice therapy for 10-20 minutes at a time.    c. Encourage gentle traction exercises and stretches as tolerated.    d. Advise patient to avoid activities that exacerbate pain.    e. Re-evaluate in 4 weeks to assess progress and consider imaging if no improvement or worsening symptoms.  -     naproxen (NAPROSYN) 500 MG tablet; Take 1 tablet (500 mg) by mouth 2 times daily (with meals) for 7 days, THEN 1 tablet (500 mg) 2 times daily as needed (Pain) for up to 21 days. Take with meals..    Elevated blood pressure    a. Re-check blood pressure during the 4-week follow-up visit.    b. Consider potential pain-related cause for elevated reading.    c. Monitor for any changes in blood pressure and  address as needed during follow-up visit.    Patient Instructions   They recommend initial treatment with cold packs or heat, NSAIDs, and other analgesics. Patients with acute sciatica should remain active but discontinue any activity causing spread of symptoms. Imaging studies should be limited to "red flag" indications (i.e., unrelenting back pain, fever, trauma, history of cancer, loss of bowel or bladder control, age older than 50 years, or acute weight loss). Referral to neurosurgery or orthopedics is indicated if symptoms progress, if there is neuromotor deficit, or if sciatica persists beyond six weeks of conservative treatment.     FOLLOW UP   4 weeks for back pain follow-up    Yong Channel, MD

## 2023-01-16 NOTE — Patient Instructions (Signed)
They recommend initial treatment with cold packs or heat, NSAIDs, and other analgesics. Patients with acute sciatica should remain active but discontinue any activity causing spread of symptoms. Imaging studies should be limited to "red flag" indications (i.e., unrelenting back pain, fever, trauma, history of cancer, loss of bowel or bladder control, age older than 50 years, or acute weight loss). Referral to neurosurgery or orthopedics is indicated if symptoms progress, if there is neuromotor deficit, or if sciatica persists beyond six weeks of conservative treatment.

## 2023-01-17 DIAGNOSIS — Z1211 Encounter for screening for malignant neoplasm of colon: Secondary | ICD-10-CM | POA: Diagnosis not present

## 2023-01-17 DIAGNOSIS — Z8379 Family history of other diseases of the digestive system: Secondary | ICD-10-CM | POA: Diagnosis not present

## 2023-01-17 DIAGNOSIS — R194 Change in bowel habit: Secondary | ICD-10-CM | POA: Diagnosis not present

## 2023-01-28 DIAGNOSIS — R52 Pain, unspecified: Secondary | ICD-10-CM | POA: Diagnosis not present

## 2023-01-28 DIAGNOSIS — J029 Acute pharyngitis, unspecified: Secondary | ICD-10-CM | POA: Diagnosis not present

## 2023-02-04 ENCOUNTER — Other Ambulatory Visit (INDEPENDENT_AMBULATORY_CARE_PROVIDER_SITE_OTHER): Payer: Self-pay | Admitting: Family Practice

## 2023-02-04 DIAGNOSIS — M5430 Sciatica, unspecified side: Secondary | ICD-10-CM

## 2023-02-13 ENCOUNTER — Ambulatory Visit (INDEPENDENT_AMBULATORY_CARE_PROVIDER_SITE_OTHER): Admitting: Family Practice

## 2023-02-17 DIAGNOSIS — K6389 Other specified diseases of intestine: Secondary | ICD-10-CM | POA: Diagnosis not present

## 2023-02-17 DIAGNOSIS — K644 Residual hemorrhoidal skin tags: Secondary | ICD-10-CM | POA: Diagnosis not present

## 2023-02-17 DIAGNOSIS — K648 Other hemorrhoids: Secondary | ICD-10-CM | POA: Diagnosis not present

## 2023-02-17 DIAGNOSIS — D122 Benign neoplasm of ascending colon: Secondary | ICD-10-CM | POA: Diagnosis not present

## 2023-02-17 DIAGNOSIS — K635 Polyp of colon: Secondary | ICD-10-CM | POA: Diagnosis not present

## 2023-02-17 DIAGNOSIS — Z1211 Encounter for screening for malignant neoplasm of colon: Secondary | ICD-10-CM | POA: Diagnosis not present

## 2023-03-19 DIAGNOSIS — Z1389 Encounter for screening for other disorder: Secondary | ICD-10-CM | POA: Diagnosis not present

## 2023-03-19 DIAGNOSIS — N959 Unspecified menopausal and perimenopausal disorder: Secondary | ICD-10-CM | POA: Diagnosis not present

## 2023-03-19 DIAGNOSIS — M629 Disorder of muscle, unspecified: Secondary | ICD-10-CM | POA: Diagnosis not present

## 2023-03-19 DIAGNOSIS — Z01419 Encounter for gynecological examination (general) (routine) without abnormal findings: Secondary | ICD-10-CM | POA: Diagnosis not present

## 2023-03-19 DIAGNOSIS — F3281 Premenstrual dysphoric disorder: Secondary | ICD-10-CM | POA: Diagnosis not present

## 2023-03-25 DIAGNOSIS — Z86010 Personal history of colonic polyps: Secondary | ICD-10-CM | POA: Diagnosis not present

## 2023-03-25 DIAGNOSIS — Z8379 Family history of other diseases of the digestive system: Secondary | ICD-10-CM | POA: Diagnosis not present

## 2023-03-25 DIAGNOSIS — K581 Irritable bowel syndrome with constipation: Secondary | ICD-10-CM | POA: Diagnosis not present

## 2023-03-31 ENCOUNTER — Ambulatory Visit: Payer: Self-pay

## 2023-03-31 ENCOUNTER — Ambulatory Visit: Payer: BC Managed Care – PPO | Attending: Obstetrics and Gynecology

## 2023-03-31 ENCOUNTER — Other Ambulatory Visit: Payer: Self-pay

## 2023-03-31 DIAGNOSIS — M6281 Muscle weakness (generalized): Secondary | ICD-10-CM | POA: Diagnosis not present

## 2023-03-31 DIAGNOSIS — K5900 Constipation, unspecified: Secondary | ICD-10-CM | POA: Insufficient documentation

## 2023-03-31 DIAGNOSIS — M62838 Other muscle spasm: Secondary | ICD-10-CM | POA: Insufficient documentation

## 2023-03-31 DIAGNOSIS — R279 Unspecified lack of coordination: Secondary | ICD-10-CM | POA: Insufficient documentation

## 2023-03-31 NOTE — Therapy (Addendum)
OUTPATIENT PHYSICAL THERAPY FEMALE PELVIC EVALUATION   Patient Name: Sherry Cobb MRN: [id number] DOB:[birth year], 37 y.o., female Today's Date: 03/31/2023  END OF SESSION:  PT End of Session - 03/31/23 1410     Visit Number 1    Date for PT Re-Evaluation 09/15/23    PT Start Time 1406    PT Stop Time 1440    PT Time Calculation (min) 34 min    Activity Tolerance Patient tolerated treatment well    Behavior During Therapy [facility] for tasks assessed/performed             Past Medical History:  Diagnosis Date   Kidney stones    Preeclampsia 2014   Pregnancy induced hypertension    Preterm labor    Past Surgical History:  Procedure Laterality Date   CESAREAN SECTION N/A 10/01/2013   Procedure: Primary Cesarean Section Delivery Baby Girl @ [phone], Apgars 5/7;  Surgeon: Bing Plume, MD;  Location: [facility];  Service: Obstetrics;  Laterality: N/A;   NOSE SURGERY     WISDOM TOOTH EXTRACTION     Patient Active Problem List   Diagnosis Date Noted   Chronic hypertension with exacerbation during pregnancy in third trimester 11/13/2019   NSVD (normal spontaneous vaginal delivery) 11/13/2019   Gestational hypertension w/o significant proteinuria in 3rd trimester 03/27/2017   VBAC, delivered 03/27/2017   Preeclampsia 10/01/2013    PCP: Lupita Raider, MD  REFERRING PROVIDER: Sherian Rein, MD  REFERRING DIAG: M62.9 (ICD-10-CM) - Disorder of muscle, unspecified  THERAPY DIAG:  Constipation, unspecified constipation type  Unspecified lack of coordination  Other muscle spasm  Muscle weakness (generalized)  Rationale for Evaluation and Treatment: Rehabilitation  ONSET DATE: 3 years   SUBJECTIVE:                                                                                                                                                                                           SUBJECTIVE STATEMENT: Pt states that she struggled with constipation in 3rd  pregnancy and has not really recovered from that  Fluid intake: Yes: 80 oz, one cup of coffee in the morning    PAIN:  Are you having pain? No   PRECAUTIONS: None  WEIGHT BEARING RESTRICTIONS: No  FALLS:  Has patient fallen in last 6 months? 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; Runner, broadcasting/film/video  PLOF: Independent  PATIENT GOALS: increase frequency and ease of bowel movements   PERTINENT HISTORY:  1 c-section (HELP syndrome), 2 vaginal deliveries  Sexual abuse: No  BOWEL MOVEMENT: Pain with bowel movement:  Yes - discomfort Type of bowel movement:Type (Bristol Stool Scale) 1, Frequency 1-2x/week, and Strain Yes Fully empty rectum: No Leakage: No Pads: No Fiber supplement: No  URINATION: Pain with urination: No Fully empty bladder: No Stream: Strong Urgency: Yes: no leaking Frequency: every hour, sometimes will wake 1x/night Leakage: Coughing and Sneezing Pads: No  INTERCOURSE: Pain with intercourse: no pain Ability to have vaginal penetration:  Yes: - Climax: WNL   PREGNANCY: Vaginal deliveries 2 Tearing Yes: minimal C-section deliveries 1 (help syndrome) Currently pregnant No  PROLAPSE: None   OBJECTIVE:  03/31/23: COGNITION: Overall cognitive status: Within functional limits for tasks assessed     SENSATION: Light touch: Appears intact Proprioception: Appears intact  FUNCTIONAL TESTS:  Perineal bear down: paradoxical contraction  GAIT: Comments: WNL  POSTURE: rounded shoulders, forward head, increased lumbar lordosis, increased thoracic kyphosis, and anterior pelvic tilt  PALPATION:   General  tenderness throughout abdomen, most notable bil lower quadrant, ileocecal valve irritation                External Perineal Exam WNL                             Internal Pelvic Floor significant tenderness/achy/pressure; high tone/pelvic floor muscle spasm  Patient  confirms identification and approves PT to assess internal pelvic floor and treatment Yes  PELVIC MMT: *paradoxical contraction with pelvic floor bear down/bulbe   MMT eval  Vaginal 1/5  Internal Anal Sphincter 2/5  External Anal Sphincter 2/5  Puborectalis 1/5  Diastasis Recti WNL  (Blank rows = not tested)        TONE: High  PROLAPSE: WNL  TODAY'S TREATMENT:                                                                                                                              DATE:  03/31/23  EVAL  Neuromuscular re-education: Diaphragmatic breathing/pelvic floor relaxation and lengthening Therapeutic activities: Squatty potty Relaxed toilet mechanics Fiber supplement Bowel schedule Bowel massage    PATIENT EDUCATION:  Education details: See above Person educated: Patient Education method: Programmer, multimedia, Demonstration, Tactile cues, Verbal cues, and Handouts Education comprehension: verbalized understanding  HOME EXERCISE PROGRAM: Written hand out  ASSESSMENT:  CLINICAL IMPRESSION: Patient is a 37 y.o. female who was seen today for physical therapy evaluation and treatment for chronic constipation since delivery of 3rd child. Exam findings notable for abdominal tenderness (including ileocecal valve), significant pelvic floor tension and discomfort palpated vaginally and rectally, vaginal and rectal pelvic floor muscle weakness, and paradoxical pelvic floor contraction with bulge. Signs and symptoms are most consistent with high tone pelvic floor and poor contraction of relaxation/contraction. Initial treatment consisted of diaphragmatic breathing, pelvic floor muscle relaxation training, toilet mechanics, fiber supplement, bowel schedule, and bowel massage; written handout provided on all intervention. She will continue to benefit from skilled PT intervention in order to improve frequency and ease  of bowel movements.   OBJECTIVE IMPAIRMENTS: decreased activity  tolerance, decreased coordination, decreased endurance, decreased strength, increased fascial restrictions, increased muscle spasms, impaired tone, postural dysfunction, and pain.   ACTIVITY LIMITATIONS: NA  PARTICIPATION LIMITATIONS: jumping, sneezing, coughing; bowel movements  PERSONAL FACTORS: 1 comorbidity: medical history  are also affecting patient's functional outcome.   REHAB POTENTIAL: Good  CLINICAL DECISION MAKING: Stable/uncomplicated  EVALUATION COMPLEXITY: Low   GOALS: Goals reviewed with patient? Yes  SHORT TERM GOALS: Target date: 04/28/23  Pt will be independent with HEP.   Baseline: Goal status: INITIAL  2.  Pt will be independent with use of squatty potty, relaxed toileting mechanics, and improved bowel movement techniques in order to increase ease of bowel movements and complete evacuation.   Baseline:  Goal status: INITIAL  3.  Pt will improve frequency of bowel movements to >3x/week consistently.  Baseline:  Goal status: INITIAL  4.  Pt will be independent with diaphragmatic breathing and down training activities in order to improve pelvic floor relaxation.  Baseline:  Goal status: INITIAL    LONG TERM GOALS: Target date: 09/15/23  Pt will be independent with advanced HEP.   Baseline:  Goal status: INITIAL  2.  Pt will improve PFIQ-7 score to less than 25 to demonstrate improved pelvic floor function.   Baseline:  Goal status: INITIAL  3.  Pt will demonstrate improved pelvic floor muscle tone and correct paradoxical contraction with bearing down in order to more easily pass bowel movement.  Baseline:  Goal status: INITIAL  4.  Patient will have >5 bowel movements a week with no straining. Baseline:  Goal status: INITIAL  5.  Pt will report no leaks with laughing, coughing, sneezing in order to improve comfort with interpersonal relationships and community activities.   Baseline:  Goal status: INITIAL  PLAN:  PT FREQUENCY:  1-2x/week  PT DURATION: 6 months  PLANNED INTERVENTIONS: Therapeutic exercises, Therapeutic activity, Neuromuscular re-education, Balance training, Gait training, Patient/Family education, Self Care, Joint mobilization, Dry Needling, Biofeedback, and Manual therapy  PLAN FOR NEXT SESSION: Begin down training activities; perform bowel mobilization; thoracolumbar fascia release.    Julio Alm, PT, DPT06/24/245:21 PM  PHYSICAL THERAPY DISCHARGE SUMMARY  Visits from Start of Care: 1  Current functional level related to goals / functional outcomes: Not met   Remaining deficits: See above   Education / Equipment: HEP   Patient agrees to discharge. Patient goals were not met. Patient is being discharged due to not returning since the last visit.  Julio Alm, PT, DPT08/19/248:16 AM

## 2023-03-31 NOTE — Patient Instructions (Addendum)
Squatty potty: When your knees are level or below the level of your hips, pelvic floor muscles are pressed against rectum, preventing ease of bowel movement. By getting knees above the level of the hips, these pelvic floor muscles relax, allowing easier passage of bowel movement. ? Ways to get knees above hips: o Squatty Potty (7inch and 9inch versions) o Small stool o Roll of toilet paper under each foot o Hardback book or stack of magazines under each foot  Relaxed Toileting mechanics: Once in this position, make sure to lean forward with forearms on thighs, wide knees, relaxed stomach, and breathe.    Balloon breathing: when trying to have a bowel movement, act like you are trying to blow up a balloon instead of straining   Bowel massage: To assist with more regular and more comfortable bowel movements, try performing bowel massage nightly for 5-10 minutes. Place hands in the lower right side of your abdomen to start; in small circles, massage up, across, and down the left side of your abdomen. Pressure does not need to be hard, but just comfortable. You can use lotion or oil to make more comfortable.   Fiber supplement: psyllium husk  Bowel retraining:  8-12 grams of fiber with each meal After each meal, go attempt to have a bowel movement within 30 minutes and don't sit on the toilet for more than 5 minutes  For two weeks.   [facility] [address] [place], [place] [address] Phone # [phone] Fax [phone]

## 2023-04-01 DIAGNOSIS — H10413 Chronic giant papillary conjunctivitis, bilateral: Secondary | ICD-10-CM | POA: Diagnosis not present

## 2023-04-01 DIAGNOSIS — H5203 Hypermetropia, bilateral: Secondary | ICD-10-CM | POA: Diagnosis not present

## 2023-04-21 ENCOUNTER — Ambulatory Visit: Payer: BC Managed Care – PPO

## 2023-05-06 DIAGNOSIS — J3489 Other specified disorders of nose and nasal sinuses: Secondary | ICD-10-CM | POA: Diagnosis not present

## 2023-05-06 DIAGNOSIS — R0981 Nasal congestion: Secondary | ICD-10-CM | POA: Diagnosis not present

## 2023-05-06 DIAGNOSIS — R519 Headache, unspecified: Secondary | ICD-10-CM | POA: Diagnosis not present

## 2023-05-26 ENCOUNTER — Ambulatory Visit: Payer: BC Managed Care – PPO

## 2023-06-02 ENCOUNTER — Ambulatory Visit: Payer: BC Managed Care – PPO

## 2023-07-02 DIAGNOSIS — N750 Cyst of Bartholin's gland: Secondary | ICD-10-CM | POA: Diagnosis not present

## 2024-02-23 ENCOUNTER — Encounter (HOSPITAL_BASED_OUTPATIENT_CLINIC_OR_DEPARTMENT_OTHER): Payer: Self-pay | Admitting: Emergency Medicine

## 2024-02-23 ENCOUNTER — Other Ambulatory Visit: Payer: Self-pay

## 2024-02-23 ENCOUNTER — Emergency Department (HOSPITAL_BASED_OUTPATIENT_CLINIC_OR_DEPARTMENT_OTHER): Admission: EM | Admit: 2024-02-23 | Discharge: 2024-02-23 | Disposition: A | Discharge status: Home / Self Care

## 2024-02-23 ENCOUNTER — Emergency Department (HOSPITAL_BASED_OUTPATIENT_CLINIC_OR_DEPARTMENT_OTHER): Admitting: Radiology

## 2024-02-23 ENCOUNTER — Other Ambulatory Visit (HOSPITAL_BASED_OUTPATIENT_CLINIC_OR_DEPARTMENT_OTHER): Payer: Self-pay

## 2024-02-23 DIAGNOSIS — R0789 Other chest pain: Secondary | ICD-10-CM | POA: Diagnosis present

## 2024-02-23 DIAGNOSIS — E876 Hypokalemia: Secondary | ICD-10-CM | POA: Insufficient documentation

## 2024-02-23 DIAGNOSIS — Z79899 Other long term (current) drug therapy: Secondary | ICD-10-CM | POA: Diagnosis not present

## 2024-02-23 DIAGNOSIS — R079 Chest pain, unspecified: Secondary | ICD-10-CM | POA: Diagnosis not present

## 2024-02-23 DIAGNOSIS — I10 Essential (primary) hypertension: Secondary | ICD-10-CM | POA: Insufficient documentation

## 2024-02-23 LAB — COMPREHENSIVE METABOLIC PANEL WITH GFR
ALT: 12 U/L (ref 0–44)
AST: 21 U/L (ref 15–41)
Albumin: 4.2 g/dL (ref 3.5–5.0)
Alkaline Phosphatase: 90 U/L (ref 38–126)
Anion gap: 13 (ref 5–15)
BUN: 15 mg/dL (ref 6–20)
CO2: 28 mmol/L (ref 22–32)
Calcium: 10.1 mg/dL (ref 8.9–10.3)
Chloride: 97 mmol/L — ABNORMAL LOW (ref 98–111)
Creatinine, Ser: 0.83 mg/dL (ref 0.44–1.00)
GFR, Estimated: >60 mL/min (ref >60)
Glucose, Bld: 88 mg/dL (ref 70–99)
Potassium: 2.7 mmol/L — CL (ref 3.5–5.1)
Sodium: 138 mmol/L (ref 135–145)
Total Bilirubin: 0.6 mg/dL (ref 0.0–1.2)
Total Protein: 7.3 g/dL (ref 6.5–8.1)

## 2024-02-23 LAB — TROPONIN T, HIGH SENSITIVITY
Troponin T High Sensitivity: <15 ng/L (ref <19)
Troponin T High Sensitivity: <15 ng/L (ref <19)

## 2024-02-23 LAB — CBC WITH DIFFERENTIAL/PLATELET
Abs Immature Granulocytes: 0.05 10*3/uL (ref 0.00–0.07)
Basophils Absolute: 0.1 10*3/uL (ref 0.0–0.1)
Basophils Relative: 1 %
Eosinophils Absolute: 0.1 10*3/uL (ref 0.0–0.5)
Eosinophils Relative: 1 %
HCT: 36 % (ref 36.0–46.0)
Hemoglobin: 11.5 g/dL — ABNORMAL LOW (ref 12.0–15.0)
Immature Granulocytes: 1 %
Lymphocytes Relative: 18 %
Lymphs Abs: 1.8 10*3/uL (ref 0.7–4.0)
MCH: 24.3 pg — ABNORMAL LOW (ref 26.0–34.0)
MCHC: 31.9 g/dL (ref 30.0–36.0)
MCV: 75.9 fL — ABNORMAL LOW (ref 80.0–100.0)
Monocytes Absolute: 0.7 10*3/uL (ref 0.1–1.0)
Monocytes Relative: 8 %
Neutro Abs: 7.1 10*3/uL (ref 1.7–7.7)
Neutrophils Relative %: 71 %
Platelets: 362 10*3/uL (ref 150–400)
RBC: 4.74 MIL/uL (ref 3.87–5.11)
RDW: 15.9 % — ABNORMAL HIGH (ref 11.5–15.5)
WBC: 9.9 10*3/uL (ref 4.0–10.5)
nRBC: 0 % (ref 0.0–0.2)

## 2024-02-23 LAB — MAGNESIUM: Magnesium: 1.9 mg/dL (ref 1.7–2.4)

## 2024-02-23 LAB — D-DIMER, QUANTITATIVE: D-Dimer, Quant: <0.27 ug{FEU}/mL (ref 0.00–0.50)

## 2024-02-23 LAB — HCG, SERUM, QUALITATIVE: Preg, Serum: NEGATIVE

## 2024-02-23 MED ORDER — POTASSIUM CHLORIDE ER 10 MEQ PO TBCR
10.0000 meq | EXTENDED_RELEASE_TABLET | Freq: Every day | ORAL | 0 refills | Status: AC
  Filled 2024-02-23 (×2): qty 7, 7d supply, fill #0

## 2024-02-23 MED ORDER — POTASSIUM CHLORIDE 20 MEQ PO PACK
60.0000 meq | PACK | Freq: Once | ORAL | Status: AC
  Administered 2024-02-23: 60 meq via ORAL
  Filled 2024-02-23: qty 3

## 2024-02-23 MED ORDER — PANTOPRAZOLE SODIUM 40 MG IV SOLR
40.0000 mg | Freq: Once | INTRAVENOUS | Status: AC
  Administered 2024-02-23: 40 mg via INTRAVENOUS
  Filled 2024-02-23: qty 10

## 2024-02-23 NOTE — Discharge Instructions (Addendum)
 Your workup today is reassuring. It is very unlikely that your pain is due to an issue in your heart.  Your cardiac enzyme (troponin) was normal today. Your EKG which is a measure of the heart's electrical activity and rhythm is normal today. These would both show abnormalities if you were having a heart attack. You blood test to check for a blood clot was normal (no signs of a blood clot).   Your chest x-ray is normal today.  You were found to have a slightly low potassium level on your labs today. You were given a potasium supplement to help bring this back up. Please increase your dietary intake of calcium with foods such as avocados, potatoes, bananas, spinach, salmon. You have been prescribed potassium supplement to take for the next week. Please have your PCP monitor this value.   Your blood pressure was elevated here today at 136/91. Aim to get 30 minutes of exercise daily and limit intake of processed/fast foods. Please take and record your blood pressures at home. Take them at the same time every day. Follow up with your PCP within the next month to discuss if you may need treatment for your blood pressure.   Return to the ER if you have any shortness of breath, difficulty breathing, worsening chest pain, dizziness, jaw pain, left arm or shoulder pain, abdominal pain, unexplained fever, any other new or concerning symptoms.

## 2024-02-23 NOTE — ED Triage Notes (Signed)
 C/o left sided Cp that rads into jaw since yesterday afternoon. Denies Hx. Reports back pain. Hx of kidney stones.

## 2024-02-23 NOTE — ED Provider Notes (Signed)
 [place] EMERGENCY DEPARTMENT AT [facility] Provider Note   CSN: [id number] Arrival date & time: 02/23/24  4098     History  Chief Complaint  Patient presents with   Chest Pain    Sherry Cobb is a 38 y.o. female with history of hypertension, nephrolithiasis, presents with concern for intermittent chest pain and tightness sensation that started last night.  Episodes come on for a couple hours at a time, then resolve on their own.  This is also associated with mild shortness of breath.  Pain is nonexertional, nonpleuritic.  Pain better when laying flat.  No radiation of pain to the back.  She is on OCP pills.  Reports a recent 2 hour car ride but no other long plane or car rides. No recent hospitalizations or surgeries. RN triage note mentions back pain, but patient states this was a couple days ago. No current back pain, abdominal pain, dysuria, hematuria, or increased frequency.  HPI     Home Medications Prior to Admission medications   Medication Sig Start Date End Date Taking? Authorizing Provider  potassium chloride  (KLOR-CON ) 10 MEQ tablet Take 1 tablet (10 mEq total) by mouth daily for 7 days. 02/23/24 03/01/24 Yes Rexie Catena, PA-C  acetaminophen  (TYLENOL ) 325 MG tablet Take 2 tablets (650 mg total) by mouth every 4 (four) hours as needed (for pain scale < 4). Patient not taking: Reported on 03/31/2023 11/14/19   Rogene Claude, MD  FLUoxetine (PROZAC) 10 MG capsule Take 10 mg by mouth daily.    [provider]  hydrochlorothiazide (HYDRODIURIL) 25 MG tablet Take 25 mg by mouth daily.    [provider]  ibuprofen  (ADVIL ) 600 MG tablet Take 1 tablet (600 mg total) by mouth every 6 (six) hours. Patient not taking: Reported on 03/31/2023 11/14/19   Rogene Claude, MD  labetalol  (NORMODYNE ) 200 MG tablet Take 1 tablet (200 mg total) by mouth 2 (two) times daily. Patient not taking: Reported on 03/31/2023 11/14/19   Rogene Claude, MD   rosuvastatin (CRESTOR) 10 MG tablet Take 10 mg by mouth daily.    [provider]  sertraline (ZOLOFT) 50 MG tablet Take 100 mg by mouth daily. 02/02/16   [provider]      Allergies    Patient has no known allergies.    Review of Systems   Review of Systems  Respiratory:  Positive for chest tightness and shortness of breath.     Physical Exam Updated Vital Signs BP (!) 136/91   Pulse 83   Temp 98.4 F (36.9 C) (Oral)   Resp 15   SpO2 99%  Physical Exam Vitals and nursing note reviewed.  Constitutional:      General: She is not in acute distress.    Appearance: She is well-developed.  HENT:     Head: Normocephalic and atraumatic.  Eyes:     Conjunctiva/sclera: Conjunctivae normal.  Cardiovascular:     Rate and Rhythm: Normal rate and regular rhythm.     Heart sounds: No murmur heard.    Comments: 2+ radial pulses and 2+ DP pulses bilaterally Pulmonary:     Effort: Pulmonary effort is normal. No respiratory distress.     Breath sounds: Normal breath sounds.  Abdominal:     Palpations: Abdomen is soft.     Tenderness: There is no abdominal tenderness.  Musculoskeletal:        General: No swelling.     Cervical back: Neck supple.     Comments:  Calf soft and nontender bilaterally  Skin:    General: Skin is warm and dry.     Capillary Refill: Capillary refill takes less than 2 seconds.  Neurological:     Mental Status: She is alert.  Psychiatric:        Mood and Affect: Mood normal.     ED Results / Procedures / Treatments   Labs (all labs ordered are listed, but only abnormal results are displayed) Labs Reviewed  CBC WITH DIFFERENTIAL/PLATELET - Abnormal; Notable for the following components:      Result Value   Hemoglobin 11.5 (*)    MCV 75.9 (*)    MCH 24.3 (*)    RDW 15.9 (*)    All other components within normal limits  COMPREHENSIVE METABOLIC PANEL WITH GFR - Abnormal; Notable for the following components:   Potassium 2.7 (*)     Chloride 97 (*)    All other components within normal limits  HCG, SERUM, QUALITATIVE  D-DIMER, QUANTITATIVE  MAGNESIUM   TROPONIN T, HIGH SENSITIVITY  TROPONIN T, HIGH SENSITIVITY    EKG EKG Interpretation Date/Time:  Monday Feb 23 2024 09:47:48 EDT Ventricular Rate:  83 PR Interval:  148 QRS Duration:  79 QT Interval:  386 QTC Calculation: 454 R Axis:   76  Text Interpretation: Sinus rhythm Nonspecific repol abnormality, diffuse leads Confirmed by Abner Hoffman [phone]) on 02/23/2024 11:04:57 AM  Radiology DG Chest 2 View Result Date: 02/23/2024 CLINICAL DATA:  Left chest pain radiating into the jaw since yesterday afternoon. EXAM: CHEST - 2 VIEW COMPARISON:  08/10/2023 FINDINGS: The heart size and mediastinal contours are within normal limits. Both lungs are clear. The visualized skeletal structures are unremarkable. IMPRESSION: No active cardiopulmonary disease. Electronically Signed   By: Catherin Closs M.D.   On: 02/23/2024 11:46    Procedures Procedures    Medications Ordered in ED Medications  pantoprazole  (PROTONIX ) injection 40 mg (40 mg Intravenous Given 02/23/24 1120)  potassium chloride  (KLOR-CON ) packet 60 mEq (60 mEq Oral Given 02/23/24 1120)    ED Course/ Medical Decision Making/ A&P                                 Medical Decision Making Amount and/or Complexity of Data Reviewed Labs: ordered. Radiology: ordered.  Risk Prescription drug management.     Differential diagnosis includes but is not limited to ACS, arrhythmia, aortic aneurysm, pericarditis, myocarditis, pericardial effusion, cardiac tamponade, musculoskeletal pain, GERD, Boerhaave's syndrome, DVT/PE, pneumonia, pleural effusion   ED Course:  Upon initial evaluation, patient well appearing, stable vitals aside from elevated BP of 178/113.  She currently reports some chest tightness, but no chest pain.  Denying any current shortness of breath.    Labs Ordered: I Ordered, and personally  interpreted labs.  The pertinent results include:   CBC with slightly low hemoglobin at 11.5 CMP with hypokalemia at 2.7 Magnesium  within normal limits Initial troponin under 15, repeat less than 15 D-dimer within normal limits  Imaging Studies ordered: I ordered imaging studies including chest x-ray I independently visualized the imaging with scope of interpretation limited to determining acute life threatening conditions related to emergency care. Imaging showed no acute abnormalities I agree with the radiologist interpretation   Cardiac Monitoring: / EKG: The patient was maintained on a cardiac monitor.  I personally viewed and interpreted the cardiac monitored which showed an underlying rhythm of: Normal sinus rhythm    Medications  Given: Protonix  for pain 60 mEq KCl for hypokalemia  Upon re-evaluation, patient still remains well-appearing, stable vitals.  Remains without chest pain..   Low concern for ACS at this time given troponin remains stable with initial troponin of less than 15 and repeat of less than 15, pain non-exertional, and EKG with normal sinus rhythm and no ST changes. Chest x-ray without any acute abnormality. No concern for DVT or PE at this time given d-dimer within normal limits.     Impression: Atypical chest pain Hypokalemia   Disposition:  The patient was discharged home with instructions to follow up with PCP regarding her chest pain symptoms and hypokalemia within the next week  Return precautions given.   This chart was dictated using voice recognition software, Dragon. Despite the best efforts of this provider to proofread and correct errors, errors may still occur which can change documentation meaning.          Final Clinical Impression(s) / ED Diagnoses Final diagnoses:  Atypical chest pain  Hypokalemia  Hypertension, unspecified type    Rx / DC Orders ED Discharge Orders          Ordered    potassium chloride  (KLOR-CON ) 10 MEQ  tablet  Daily        02/23/24 1203              Rexie Catena, PA-C 02/23/24 1244    Carin Charleston, MD 02/23/24 [phone]

## 2024-02-23 NOTE — ED Notes (Signed)
Spoke with lab to add on Mag

## 2024-05-31 DIAGNOSIS — M7918 Myalgia, other site: Secondary | ICD-10-CM | POA: Diagnosis not present

## 2024-05-31 DIAGNOSIS — M9901 Segmental and somatic dysfunction of cervical region: Secondary | ICD-10-CM | POA: Diagnosis not present

## 2024-05-31 DIAGNOSIS — M9902 Segmental and somatic dysfunction of thoracic region: Secondary | ICD-10-CM | POA: Diagnosis not present

## 2024-05-31 DIAGNOSIS — M542 Cervicalgia: Secondary | ICD-10-CM | POA: Diagnosis not present

## 2024-06-09 DIAGNOSIS — M7918 Myalgia, other site: Secondary | ICD-10-CM | POA: Diagnosis not present

## 2024-06-09 DIAGNOSIS — M9901 Segmental and somatic dysfunction of cervical region: Secondary | ICD-10-CM | POA: Diagnosis not present

## 2024-06-09 DIAGNOSIS — M542 Cervicalgia: Secondary | ICD-10-CM | POA: Diagnosis not present

## 2024-06-09 DIAGNOSIS — M9902 Segmental and somatic dysfunction of thoracic region: Secondary | ICD-10-CM | POA: Diagnosis not present

## 2024-06-14 DIAGNOSIS — M542 Cervicalgia: Secondary | ICD-10-CM | POA: Diagnosis not present

## 2024-06-14 DIAGNOSIS — M7918 Myalgia, other site: Secondary | ICD-10-CM | POA: Diagnosis not present

## 2024-06-14 DIAGNOSIS — M9902 Segmental and somatic dysfunction of thoracic region: Secondary | ICD-10-CM | POA: Diagnosis not present

## 2024-06-14 DIAGNOSIS — M9901 Segmental and somatic dysfunction of cervical region: Secondary | ICD-10-CM | POA: Diagnosis not present

## 2024-06-25 DIAGNOSIS — L659 Nonscarring hair loss, unspecified: Secondary | ICD-10-CM | POA: Diagnosis not present

## 2024-06-25 DIAGNOSIS — J029 Acute pharyngitis, unspecified: Secondary | ICD-10-CM | POA: Diagnosis not present

## 2024-06-25 DIAGNOSIS — E876 Hypokalemia: Secondary | ICD-10-CM | POA: Diagnosis not present

## 2024-06-25 DIAGNOSIS — K219 Gastro-esophageal reflux disease without esophagitis: Secondary | ICD-10-CM | POA: Diagnosis not present

## 2024-06-25 DIAGNOSIS — D509 Iron deficiency anemia, unspecified: Secondary | ICD-10-CM | POA: Diagnosis not present

## 2024-06-30 DIAGNOSIS — M9901 Segmental and somatic dysfunction of cervical region: Secondary | ICD-10-CM | POA: Diagnosis not present

## 2024-06-30 DIAGNOSIS — M9902 Segmental and somatic dysfunction of thoracic region: Secondary | ICD-10-CM | POA: Diagnosis not present

## 2024-06-30 DIAGNOSIS — M542 Cervicalgia: Secondary | ICD-10-CM | POA: Diagnosis not present

## 2024-06-30 DIAGNOSIS — M7918 Myalgia, other site: Secondary | ICD-10-CM | POA: Diagnosis not present

## 2024-07-02 DIAGNOSIS — M9901 Segmental and somatic dysfunction of cervical region: Secondary | ICD-10-CM | POA: Diagnosis not present

## 2024-07-02 DIAGNOSIS — M542 Cervicalgia: Secondary | ICD-10-CM | POA: Diagnosis not present

## 2024-07-02 DIAGNOSIS — M7918 Myalgia, other site: Secondary | ICD-10-CM | POA: Diagnosis not present

## 2024-07-02 DIAGNOSIS — M9902 Segmental and somatic dysfunction of thoracic region: Secondary | ICD-10-CM | POA: Diagnosis not present

## 2024-07-05 DIAGNOSIS — L820 Inflamed seborrheic keratosis: Secondary | ICD-10-CM | POA: Diagnosis not present

## 2024-07-05 DIAGNOSIS — L812 Freckles: Secondary | ICD-10-CM | POA: Diagnosis not present

## 2024-07-05 DIAGNOSIS — D225 Melanocytic nevi of trunk: Secondary | ICD-10-CM | POA: Diagnosis not present

## 2024-07-05 DIAGNOSIS — L301 Dyshidrosis [pompholyx]: Secondary | ICD-10-CM | POA: Diagnosis not present

## 2024-07-09 DIAGNOSIS — M542 Cervicalgia: Secondary | ICD-10-CM | POA: Diagnosis not present

## 2024-07-09 DIAGNOSIS — M7918 Myalgia, other site: Secondary | ICD-10-CM | POA: Diagnosis not present

## 2024-07-09 DIAGNOSIS — M9902 Segmental and somatic dysfunction of thoracic region: Secondary | ICD-10-CM | POA: Diagnosis not present

## 2024-07-09 DIAGNOSIS — M9901 Segmental and somatic dysfunction of cervical region: Secondary | ICD-10-CM | POA: Diagnosis not present

## 2024-08-04 DIAGNOSIS — B078 Other viral warts: Secondary | ICD-10-CM | POA: Diagnosis not present

## 2024-08-04 DIAGNOSIS — B079 Viral wart, unspecified: Secondary | ICD-10-CM | POA: Diagnosis not present

## 2024-08-06 DIAGNOSIS — E876 Hypokalemia: Secondary | ICD-10-CM | POA: Diagnosis not present

## 2024-08-06 DIAGNOSIS — D509 Iron deficiency anemia, unspecified: Secondary | ICD-10-CM | POA: Diagnosis not present

## 2024-08-14 DIAGNOSIS — J029 Acute pharyngitis, unspecified: Secondary | ICD-10-CM | POA: Diagnosis not present

## 2024-08-22 DIAGNOSIS — Z711 Person with feared health complaint in whom no diagnosis is made: Secondary | ICD-10-CM | POA: Diagnosis not present

## 2024-08-27 DIAGNOSIS — M7918 Myalgia, other site: Secondary | ICD-10-CM | POA: Diagnosis not present

## 2024-08-27 DIAGNOSIS — M9901 Segmental and somatic dysfunction of cervical region: Secondary | ICD-10-CM | POA: Diagnosis not present

## 2024-08-27 DIAGNOSIS — E876 Hypokalemia: Secondary | ICD-10-CM | POA: Diagnosis not present

## 2024-08-27 DIAGNOSIS — M542 Cervicalgia: Secondary | ICD-10-CM | POA: Diagnosis not present

## 2024-08-27 DIAGNOSIS — M9902 Segmental and somatic dysfunction of thoracic region: Secondary | ICD-10-CM | POA: Diagnosis not present

## 2024-09-09 DIAGNOSIS — J029 Acute pharyngitis, unspecified: Secondary | ICD-10-CM | POA: Diagnosis not present

## 2024-09-09 DIAGNOSIS — R0683 Snoring: Secondary | ICD-10-CM | POA: Diagnosis not present

## 2024-09-09 DIAGNOSIS — J310 Chronic rhinitis: Secondary | ICD-10-CM | POA: Diagnosis not present

## 2024-09-10 DIAGNOSIS — E876 Hypokalemia: Secondary | ICD-10-CM | POA: Diagnosis not present

## 2024-09-11 DIAGNOSIS — R82998 Other abnormal findings in urine: Secondary | ICD-10-CM | POA: Diagnosis not present

## 2024-09-21 DIAGNOSIS — M9902 Segmental and somatic dysfunction of thoracic region: Secondary | ICD-10-CM | POA: Diagnosis not present

## 2024-09-21 DIAGNOSIS — M542 Cervicalgia: Secondary | ICD-10-CM | POA: Diagnosis not present

## 2024-09-21 DIAGNOSIS — M7918 Myalgia, other site: Secondary | ICD-10-CM | POA: Diagnosis not present

## 2024-09-21 DIAGNOSIS — M9901 Segmental and somatic dysfunction of cervical region: Secondary | ICD-10-CM | POA: Diagnosis not present
